# Patient Record
Sex: Female | Born: 1970 | Race: White | Hispanic: No | Marital: Married | Smoking: Never smoker
Health system: Southern US, Community
[De-identification: ages and names within clinical notes are randomized; demographics above are authoritative.]

## PROBLEM LIST (undated history)

## (undated) DIAGNOSIS — D649 Anemia, unspecified: Secondary | ICD-10-CM

## (undated) DIAGNOSIS — K219 Gastro-esophageal reflux disease without esophagitis: Secondary | ICD-10-CM

## (undated) DIAGNOSIS — J45909 Unspecified asthma, uncomplicated: Secondary | ICD-10-CM

## (undated) DIAGNOSIS — M1990 Unspecified osteoarthritis, unspecified site: Secondary | ICD-10-CM

## (undated) DIAGNOSIS — E079 Disorder of thyroid, unspecified: Secondary | ICD-10-CM

## (undated) HISTORY — DX: Unspecified asthma, uncomplicated: J45.909

## (undated) HISTORY — PX: COLONOSCOPY: SHX174

## (undated) HISTORY — DX: Unspecified osteoarthritis, unspecified site: M19.90

## (undated) HISTORY — PX: EYE SURGERY: SHX253

## (undated) HISTORY — DX: Disorder of thyroid, unspecified: E07.9

## (undated) HISTORY — PX: CYST EXCISION: SHX5701

---

## 2008-02-24 ENCOUNTER — Ambulatory Visit: Payer: Self-pay

## 2009-08-22 ENCOUNTER — Ambulatory Visit: Payer: Self-pay | Admitting: Unknown Physician Specialty

## 2009-09-04 ENCOUNTER — Encounter: Payer: Self-pay | Admitting: General Practice

## 2009-09-20 ENCOUNTER — Encounter: Payer: Self-pay | Admitting: General Practice

## 2010-08-11 ENCOUNTER — Emergency Department: Payer: Self-pay | Admitting: Emergency Medicine

## 2012-06-03 ENCOUNTER — Ambulatory Visit: Payer: Self-pay | Admitting: Otolaryngology

## 2012-06-03 LAB — PREGNANCY, URINE: Pregnancy Test, Urine: NEGATIVE m[IU]/mL

## 2012-12-22 ENCOUNTER — Ambulatory Visit: Payer: Self-pay

## 2013-04-18 DIAGNOSIS — E063 Autoimmune thyroiditis: Secondary | ICD-10-CM | POA: Insufficient documentation

## 2014-02-01 ENCOUNTER — Ambulatory Visit: Payer: Self-pay

## 2014-09-27 ENCOUNTER — Ambulatory Visit: Payer: Self-pay | Admitting: Gastroenterology

## 2015-02-07 NOTE — Op Note (Signed)
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   PROCEDURE: Microlaryngoscopy with microsurgical excision of left true vocal cord nodule.   SURGEON: Marion DownerScott Medard Decuir, MD   ANESTHESIA: General endotracheal.   INDICATIONS: This is a 44 year old female with a lesion of the left true vocal cord causing hoarseness.   FINDINGS: This is a polypoid nodule involving the anterior left vocal cord. It is very well circumscribed and just under the surface.   COMPLICATIONS: None.   DESCRIPTION OF PROCEDURE: After obtaining informed consent, the patient was taken to the Operating Room and placed in the supine position. After induction of general endotracheal anesthesia, the patient was turned 90 degrees, placed into position on a shoulder roll with the head extended and eyes protected. A tooth guard was used to protect the upper teeth. A Dedo laryngoscope was then placed in the laryngeal inlet to visualize the vocal cords. The patient was a bit anterior making visualization of the nodule somewhat difficult but with proper positioning and suspension with the Lewy arm I was able to get visualization with some assistance by pressing on the anterior neck to move the nodule into position for removal. The tooth guard was kept in place at all times to protect the upper teeth. The nodule was visualized and photodocumentation of the nodule obtained using a 0 degree scope. The microscope was then moved into position and the vocal cord injected with a very small amount of 1% lidocaine with epinephrine 1:200,000.  With the scrub nurse pressing on the anterior neck to move the larynx slightly posteriorly and move the nodule into position, the nodule was excised using microsurgical dissection with the microlaryngeal scissors. Care  was taken not to injure the underlying vocalis muscle and just basically remove the overlying epithelium where the nodule was located after which the nodule was easily removed. Afrin moistened pledget was used to control minor oozing and postoperative photodocumentation obtained. The patient was then taken out of suspension and returned to the anesthesiologist for awakening. She was awakened and taken to the recovery room in good condition postoperatively. Blood loss was minimal.  ____________________________ Ollen GrossPaul S. Willeen CassBennett, MD psb:slb D: 06/03/2012 17:29:17 ET T: 06/04/2012 08:08:26 ET JOB#: [id number]  cc: Ollen GrossPaul S. Willeen CassBennett, MD, <Dictator> Sandi MealyPAUL S Jermey Closs MD ELECTRONICALLY SIGNED 06/30/2012 8:35

## 2015-02-13 LAB — SURGICAL PATHOLOGY

## 2016-09-03 ENCOUNTER — Other Ambulatory Visit: Payer: Self-pay | Admitting: Obstetrics and Gynecology

## 2016-09-03 DIAGNOSIS — Z1231 Encounter for screening mammogram for malignant neoplasm of breast: Secondary | ICD-10-CM

## 2016-09-09 ENCOUNTER — Encounter: Payer: Self-pay | Admitting: *Deleted

## 2016-09-17 ENCOUNTER — Ambulatory Visit
Admission: RE | Admit: 2016-09-17 | Discharge: 2016-09-17 | Disposition: A | Discharge status: Home / Self Care | Payer: Managed Care, Other (non HMO) | Source: Ambulatory Visit | Attending: Otolaryngology | Admitting: Otolaryngology

## 2016-09-17 ENCOUNTER — Encounter
Admission: RE | Disposition: A | Discharge status: Home / Self Care | Payer: Self-pay | Source: Ambulatory Visit | Attending: Otolaryngology

## 2016-09-17 ENCOUNTER — Ambulatory Visit: Payer: Managed Care, Other (non HMO) | Admitting: Anesthesiology

## 2016-09-17 DIAGNOSIS — J3489 Other specified disorders of nose and nasal sinuses: Secondary | ICD-10-CM | POA: Insufficient documentation

## 2016-09-17 DIAGNOSIS — K219 Gastro-esophageal reflux disease without esophagitis: Secondary | ICD-10-CM | POA: Diagnosis not present

## 2016-09-17 HISTORY — DX: Gastro-esophageal reflux disease without esophagitis: K21.9

## 2016-09-17 HISTORY — PX: SEPTOPLASTY: SHX2393

## 2016-09-17 HISTORY — DX: Anemia, unspecified: D64.9

## 2016-09-17 SURGERY — SEPTOPLASTY, NOSE
Anesthesia: General | Site: Nose | Wound class: Clean Contaminated

## 2016-09-17 MED ORDER — DOXYCYCLINE HYCLATE 100 MG PO CAPS
ORAL_CAPSULE | ORAL | 0 refills | Status: DC
Start: 2016-09-17 — End: 2023-08-20

## 2016-09-17 MED ORDER — ONDANSETRON HCL 4 MG/2ML IJ SOLN
INTRAMUSCULAR | Status: DC | PRN
  Administered 2016-09-17: 4 mg via INTRAVENOUS

## 2016-09-17 MED ORDER — SCOPOLAMINE 1 MG/3DAYS TD PT72
1.0000 | MEDICATED_PATCH | TRANSDERMAL | Status: DC
  Administered 2016-09-17: 1.5 mg via TRANSDERMAL

## 2016-09-17 MED ORDER — LIDOCAINE-EPINEPHRINE 1 %-1:100000 IJ SOLN
INTRAMUSCULAR | Status: DC | PRN
  Administered 2016-09-17: 9 mL

## 2016-09-17 MED ORDER — FENTANYL CITRATE (PF) 100 MCG/2ML IJ SOLN
INTRAMUSCULAR | Status: DC | PRN
  Administered 2016-09-17: 100 ug via INTRAVENOUS

## 2016-09-17 MED ORDER — OXYCODONE HCL 5 MG/5ML PO SOLN: 5.0000 mg | Freq: Once | ORAL | Status: DC | PRN

## 2016-09-17 MED ORDER — ACETAMINOPHEN 10 MG/ML IV SOLN
1000.0000 mg | Freq: Once | INTRAVENOUS | Status: AC
  Administered 2016-09-17: 1000 mg via INTRAVENOUS

## 2016-09-17 MED ORDER — PROPOFOL 10 MG/ML IV BOLUS
INTRAVENOUS | Status: DC | PRN
  Administered 2016-09-17: 150 mg via INTRAVENOUS

## 2016-09-17 MED ORDER — FENTANYL CITRATE (PF) 100 MCG/2ML IJ SOLN: 25.0000 ug | INTRAMUSCULAR | Status: DC | PRN

## 2016-09-17 MED ORDER — OXYCODONE HCL 5 MG PO TABS: 5.0000 mg | ORAL_TABLET | Freq: Once | ORAL | Status: DC | PRN

## 2016-09-17 MED ORDER — PROMETHAZINE HCL 25 MG PO TABS: 25.0000 mg | ORAL_TABLET | Freq: Four times a day (QID) | ORAL | 0 refills | Status: DC | PRN

## 2016-09-17 MED ORDER — PROMETHAZINE HCL 25 MG/ML IJ SOLN: 6.2500 mg | INTRAMUSCULAR | Status: DC | PRN

## 2016-09-17 MED ORDER — ROCURONIUM BROMIDE 100 MG/10ML IV SOLN
INTRAVENOUS | Status: DC | PRN
  Administered 2016-09-17: 15 mg via INTRAVENOUS

## 2016-09-17 MED ORDER — LACTATED RINGERS IV SOLN
INTRAVENOUS | Status: DC
  Administered 2016-09-17: 08:00:00 via INTRAVENOUS

## 2016-09-17 MED ORDER — HYDROCODONE-ACETAMINOPHEN 5-325 MG PO TABS: 1.0000 | ORAL_TABLET | Freq: Four times a day (QID) | ORAL | 0 refills | Status: DC | PRN

## 2016-09-17 MED ORDER — OXYMETAZOLINE HCL 0.05 % NA SOLN
NASAL | Status: DC | PRN
  Administered 2016-09-17: 1 via TOPICAL

## 2016-09-17 MED ORDER — DEXAMETHASONE SODIUM PHOSPHATE 4 MG/ML IJ SOLN
INTRAMUSCULAR | Status: DC | PRN
  Administered 2016-09-17: 10 mg via INTRAVENOUS

## 2016-09-17 MED ORDER — GLYCOPYRROLATE 0.2 MG/ML IJ SOLN
INTRAMUSCULAR | Status: DC | PRN
  Administered 2016-09-17: .1 mg via INTRAVENOUS

## 2016-09-17 MED ORDER — MIDAZOLAM HCL 5 MG/5ML IJ SOLN
INTRAMUSCULAR | Status: DC | PRN
  Administered 2016-09-17: 2 mg via INTRAVENOUS

## 2016-09-17 MED ORDER — LIDOCAINE HCL (CARDIAC) 20 MG/ML IV SOLN
INTRAVENOUS | Status: DC | PRN
  Administered 2016-09-17: 40 mg via INTRAVENOUS

## 2016-09-17 SURGICAL SUPPLY — 18 items
CANISTER SUCT 1200ML W/VALVE (MISCELLANEOUS) ×3 IMPLANT
COAG SUCT 10F 3.5MM HAND CTRL (MISCELLANEOUS) ×3 IMPLANT
DRAPE HEAD BAR (DRAPES) ×3 IMPLANT
GLOVE EXAM LX STRL 7.5 (GLOVE) ×6 IMPLANT
KIT ROOM TURNOVER OR (KITS) ×3 IMPLANT
NEEDLE HYPO 25GX1X1/2 BEV (NEEDLE) ×3 IMPLANT
NS IRRIG 500ML POUR BTL (IV SOLUTION) ×3 IMPLANT
PACK DRAPE NASAL/ENT (PACKS) ×3 IMPLANT
PAD GROUND ADULT SPLIT (MISCELLANEOUS) ×3 IMPLANT
PATTIES SURGICAL .5 X3 (DISPOSABLE) ×3 IMPLANT
SPLINT NASAL SEPTAL PRE-CUT (MISCELLANEOUS) ×3 IMPLANT
SUT CHROMIC 4 0 RB 1X27 (SUTURE) ×3 IMPLANT
SUT ETHILON 4-0 (SUTURE) ×2
SUT ETHILON 4-0 FS2 18XMFL BLK (SUTURE) ×1
SUT PLAIN GUT 4-0 (SUTURE) ×3 IMPLANT
SUTURE ETHLN 4-0 FS2 18XMF BLK (SUTURE) ×1 IMPLANT
SYRINGE 10CC LL (SYRINGE) ×3 IMPLANT
TOWEL OR 17X26 4PK STRL BLUE (TOWEL DISPOSABLE) ×3 IMPLANT

## 2016-09-17 NOTE — Anesthesia Procedure Notes (Signed)
Procedure Name: Intubation Date/Time: 09/17/2016 9:02 AM Performed by: Jimmy PicketAMYOT, Faolan [place] Pre-anesthesia Checklist: Patient identified, Emergency Drugs available, Suction available, Patient being monitored and Timeout performed Patient Re-evaluated:Patient Re-evaluated prior to inductionOxygen Delivery Method: Circle system utilized Preoxygenation: Pre-oxygenation with 100% oxygen Intubation Type: IV induction Ventilation: Mask ventilation without difficulty Laryngoscope Size: Miller and 2 Grade View: Grade I Tube type: Oral Rae Tube size: 7.0 mm Number of attempts: 1 Placement Confirmation: ETT inserted through vocal cords under direct vision,  positive ETCO2 and breath sounds checked- equal and bilateral Tube secured with: Tape Dental Injury: Teeth and Oropharynx as per pre-operative assessment

## 2016-09-17 NOTE — Anesthesia Preprocedure Evaluation (Signed)
Anesthesia Evaluation  Patient identified by MRN, date of birth, ID band Patient awake    Reviewed: Allergy & Precautions, H&P , NPO status , Patient's Chart, lab work & pertinent test results, reviewed documented beta blocker date and time   Airway Mallampati: II  TM Distance: >3 FB Neck ROM: full    Dental no notable dental hx.    Pulmonary neg pulmonary ROS,    Pulmonary exam normal breath sounds clear to auscultation       Cardiovascular Exercise Tolerance: Good negative cardio ROS   Rhythm:regular Rate:Normal     Neuro/Psych negative neurological ROS  negative psych ROS   GI/Hepatic Neg liver ROS, GERD  ,  Endo/Other  negative endocrine ROS  Renal/GU negative Renal ROS  negative genitourinary   Musculoskeletal   Abdominal   Peds  Hematology  (+) anemia ,   Anesthesia Other Findings   Reproductive/Obstetrics negative OB ROS                             Anesthesia Physical Anesthesia Plan  ASA: II  Anesthesia Plan: General   Post-op Pain Management:    Induction:   Airway Management Planned:   Additional Equipment:   Intra-op Plan:   Post-operative Plan:   Informed Consent: I have reviewed the patients History and Physical, chart, labs and discussed the procedure including the risks, benefits and alternatives for the proposed anesthesia with the patient or authorized representative who has indicated his/her understanding and acceptance.   Dental Advisory Given  Plan Discussed with: CRNA  Anesthesia Plan Comments:         Anesthesia Quick Evaluation

## 2016-09-17 NOTE — Transfer of Care (Signed)
Immediate Anesthesia Transfer of Care Note  Patient: Alice Turner  Procedure(s) Performed: Procedure(s): REPAIR OF SEPTAL PERFORATION (N/A)  Patient Location: PACU  Anesthesia Type: General  Level of Consciousness: awake, alert  and patient cooperative  Airway and Oxygen Therapy: Patient Spontanous Breathing and Patient connected to supplemental oxygen  Post-op Assessment: Post-op Vital signs reviewed, Patient's Cardiovascular Status Stable, Respiratory Function Stable, Patent Airway and No signs of Nausea or vomiting  Post-op Vital Signs: Reviewed and stable  Complications: No apparent anesthesia complications

## 2016-09-17 NOTE — Anesthesia Postprocedure Evaluation (Signed)
Anesthesia Post Note  Patient: Janina Mayoonja W Martinec  Procedure(s) Performed: Procedure(s) (LRB): REPAIR OF SEPTAL PERFORATION (N/A)  Patient location during evaluation: PACU Anesthesia Type: General Level of consciousness: awake and alert Pain management: pain level controlled Vital Signs Assessment: post-procedure vital signs reviewed and stable Respiratory status: spontaneous breathing, nonlabored ventilation, respiratory function stable and patient connected to nasal cannula oxygen Cardiovascular status: blood pressure returned to baseline and stable Postop Assessment: no signs of nausea or vomiting Anesthetic complications: no    Scarlette Sliceachel B Beach

## 2016-09-17 NOTE — Op Note (Signed)
09/17/2016  11:25 AM    Janina Mayoonja W Palka  [id number]   Pre-Op Diagnosis:  Septal Perforation  Post-op Diagnosis: Same  Procedure: Repair of nasal septal perforation with mucoperichondrial advancement flaps  Surgeon:  Sandi MealyBennett, Labib Cwynar S  Anesthesia:  General endotracheal  EBL:  Less than 25cc  Complications:  None  Findings: right septal deviation with bony spur. 6-7 mm anterior perforation  Procedure: The patient was taken to the Operating Room and placed in the supine position.  After induction of general endotracheal anesthesia, the table was turned 90 degrees. A time-out was issued to confirm the site and procedure. The nasal septum and lateral nasal wall where then injected with 1% lidocaine with epiniephrine, 1:100,000. The nose was decongested with Afrin soaked pledgets. The patient was then prepped and draped in the usual sterile fashion. Beginning on the left hand side a hemitransfixion incision was then created on the leading edge of the septum on the left.  A subperichondrial plane was elevated posteriorly on the left take care around the perforation to divide the tissue at the perforation edges without creating additional septal defects, and taken back to the perpendicular plate of the ethmoid where subperiosteal plane was elevated posteriorly on the left. The mucoperichondrial flap was elevated in a similar fashion on the right. A large septal spur was identified on the right hand side.  An inferior rim of redundant septal cartilage was removed from where it deviated over the maxillary crest. The perpendicular plate of the ethmoid was separated from the quadrangular cartilage, and a segment of cartilage harvested for grafting, well posterior to the existing cartilage defect. The large septal spur was removed, dividing the septal bone superiorly with Knight scissors, and inferiorly from the maxillary crest with a chisel. There was some bleeding from the maxillary crest which was  easily controlled with the suction Bovie.   A small tear in the mucoperichondrium was unavoidable on the right with dissection around the spur. The mucoperichondrial flaps were widely elevated bilaterally, with elevation extending along the floor of the nose on the left, and up the lateral nasal wall under the inferior turbinate. A horizontal relaxing incision was made where the inferior turbinate attaches to the lateral nasal wall to allow advancement of the flap. An incision was also made superiorly near the dorsal edge of the septal cartilage to allow advancement of the superior aspect of the flap. Because of the existing mucosal tear near the floor of the nose on the right, no additional relaxing incision was made inferiorly, but one was made superiorly along the dorsum. The cartilage graft was thinned out slightly, and was then carefully placed into position to close the existing defect. 4-0 plain gut suture was then used as a quilting stitch to begin reapproximating the flaps to the underlying cartilage, securing the cartilage graft into good position, and carefully advancing the mucoperichondrial flaps towards the defect to close it. Initial sutures were well away from the perforation margin, so as to reduce mucosal tension, then closer to the margin as the closure progressed. Good closure was achieved.   The septal incision was then closed with 4-0 chromic gut suture.  With the septoplasty completed and no active bleeding, nasal septal splints were placed within each nostril and affixed to the septum using a 4-0 nylon suture.  The patient was then returned to the anesthesiologist for awakening, and was taken to the Recovery Room in stable condition.  Disposition:   PACU to home  Plan: Take pain  medications and antibiotics as prescribed. Begin use of saline spray to clear secretions 2-3 times daily, starting tomorrow. No strenuous activity for 2 weeks. Follow-up next Monday.  Sandi Mealy 09/17/2016 11:25 AM

## 2016-09-17 NOTE — H&P (Signed)
History and physical reviewed and will be scanned in later. No change in medical status reported by the patient or family, appears stable for surgery. Some mild post nasal drainage without fever.All questions regarding the procedure answered, and patient (or family if a child) expressed understanding of the procedure.  Sandi MealyBennett, Ebenezer Mccaskey S @TODAY @

## 2016-09-17 NOTE — Discharge Instructions (Signed)
General Anesthesia, Adult, Care After These instructions provide you with information about caring for yourself after your procedure. Your health care provider may also give you more specific instructions. Your treatment has been planned according to current medical practices, but problems sometimes occur. Call your health care provider if you have any problems or questions after your procedure. What can I expect after the procedure? After the procedure, it is common to have:  Vomiting.  A sore throat.  Mental slowness. It is common to feel:  Nauseous.  Cold or shivery.  Sleepy.  Tired.  Sore or achy, even in parts of your body where you did not have surgery. Follow these instructions at home: For at least 24 hours after the procedure:  Do not:  Participate in activities where you could fall or become injured.  Drive.  Use heavy machinery.  Drink alcohol.  Take sleeping pills or medicines that cause drowsiness.  Make important decisions or sign legal documents.  Take care of children on your own.  Rest. Eating and drinking  If you vomit, drink water, juice, or soup when you can drink without vomiting.  Drink enough fluid to keep your urine clear or pale yellow.  Make sure you have little or no nausea before eating solid foods.  Follow the diet recommended by your health care provider. General instructions  Have a responsible adult stay with you until you are awake and alert.  Return to your normal activities as told by your health care provider. Ask your health care provider what activities are safe for you.  Take over-the-counter and prescription medicines only as told by your health care provider.  If you smoke, do not smoke without supervision.  Keep all follow-up visits as told by your health care provider. This is important. Contact a health care provider if:  You continue to have nausea or vomiting at home, and medicines are not helpful.  You  cannot drink fluids or start eating again.  You cannot urinate after 8-12 hours.  You develop a skin rash.  You have fever.  You have increasing redness at the site of your procedure. Get help right away if:  You have difficulty breathing.  You have chest pain.  You have unexpected bleeding.  You feel that you are having a life-threatening or urgent problem. This information is not intended to replace advice given to you by your health care provider. Make sure you discuss any questions you have with your health care provider. Document Released: 01/13/2001 Document Revised: 03/11/2016 Document Reviewed: 09/21/2015 Elsevier Interactive Patient Education  2017 [employer]  Scopolamine skin patches REMOVE PATCH IN 72 HOURS and WASH HANDS IMMEDIATELY What is this medicine? SCOPOLAMINE (skoe POL a meen) is used to prevent nausea and vomiting caused by motion sickness, anesthesia and surgery. This medicine may be used for other purposes; ask your health care provider or pharmacist if you have questions. COMMON BRAND NAME(S): Transderm Scop What should I tell my health care provider before I take this medicine? They need to know if you have any of these conditions: -glaucoma -kidney or liver disease -an unusual or allergic reaction (especially skin allergy) to scopolamine, atropine, other medicines, foods, dyes, or preservatives -pregnant or trying to get pregnant -breast-feeding How should I use this medicine? This medicine is for external use only. Follow the directions on the prescription label. One patch contains enough medicine to prevent motion sickness for up to 3 days. Apply the patch at least 4 hours before you  need it and only wear one disc at a time. Choose an area behind the ear, that is clean, dry, hairless and free from any cuts or irritation. Wipe the area with a clean dry tissue. Peel off the plastic backing of the skin patch, trying not to touch the adhesive side with  your hands. Do not cut the patches. Firmly apply to the area you have chosen, with the metallic side of the patch to the skin and the tan-colored side showing. Once firmly in place, wash your hands well with soap and water. Remove the disc after 3 days, or sooner if you no longer need it. After removing the patch, wash your hands and the area behind your ear thoroughly with soap and water. The patch will still contain some medicine after use. To avoid accidental contact or ingestion by children or pets, fold the used patch in half with the sticky side together and throw away in the trash out of the reach of children and pets. If you need to use a second patch after you remove the first, place it behind the other ear. Talk to your pediatrician regarding the use of this medicine in children. Special care may be needed. Overdosage: If you think you have taken too much of this medicine contact a poison control center or emergency room at once. NOTE: This medicine is only for you. Do not share this medicine with others. What if I miss a dose? Make sure you apply the patch at least 4 hours before you need it. You can apply it the night before traveling. What may interact with this medicine? -benztropine -bethanechol -medicines for anxiety or sleeping problems like diazepam or temazepam -medicines for hay fever and other allergies -medicines for mental depression -muscle relaxants This list may not describe all possible interactions. Give your health care provider a list of all the medicines, herbs, non-prescription drugs, or dietary supplements you use. Also tell them if you smoke, drink alcohol, or use illegal drugs. Some items may interact with your medicine. What should I watch for while using this medicine? Keep the patch dry, if possible, to prevent it from falling off. Limited contact with water, however, as in bathing or swimming, will not affect the system. If the patch falls off, throw it away and  put a new one behind the other ear. You may get drowsy or dizzy. Do not drive, use machinery, or do anything that needs mental alertness until you know how this medicine affects you. Do not stand or sit up quickly, especially if you are an older patient. This reduces the risk of dizzy or fainting spells. Alcohol may interfere with the effect of this medicine. Avoid alcoholic drinks. Your mouth may get dry. Chewing sugarless gum or sucking hard candy, and drinking plenty of water may help. Contact your doctor if the problem does not go away or is severe. This medicine may cause dry eyes and blurred vision. If you wear contact lenses you may feel some discomfort. Lubricating drops may help. See your eye doctor if the problem does not go away or is severe. If you are going to have a magnetic resonance imaging (MRI) procedure, tell your MRI technician if you have this patch on your body. It must be removed before a MRI. What side effects may I notice from receiving this medicine? Side effects that you should report to your doctor or health care professional as soon as possible: -agitation, nervousness, confusion -blurred vision and other eye problems -  dizziness, drowsiness -eye pain or redness in the whites of the eye -hallucinations -pain or difficulty passing urine -skin rash, itching -vomiting Side effects that usually do not require medical attention (report to your doctor or health care professional if they continue or are bothersome): -headache -nausea This list may not describe all possible side effects. Call your doctor for medical advice about side effects. You may report side effects to FDA at 1-800-FDA-1088. Where should I keep my medicine? Keep out of the reach of children. Store at room temperature between 20 and 25 degrees C (68 and 77 degrees F). Throw away any unused medicine after the expiration date. When you remove a patch, fold it and throw it in the trash as described  above. NOTE: This sheet is a summary. It may not cover all possible information. If you have questions about this medicine, talk to your doctor, pharmacist, or health care provider.  2017 Elsevier/Gold Standard (2012-03-05 13:31:48)

## 2016-09-18 ENCOUNTER — Encounter: Payer: Self-pay | Admitting: Otolaryngology

## 2016-09-20 ENCOUNTER — Ambulatory Visit
Admission: RE | Admit: 2016-09-20 | Discharge: 2016-09-20 | Disposition: A | Discharge status: Home / Self Care | Payer: Managed Care, Other (non HMO) | Source: Ambulatory Visit | Attending: Obstetrics and Gynecology | Admitting: Obstetrics and Gynecology

## 2016-09-20 DIAGNOSIS — Z1231 Encounter for screening mammogram for malignant neoplasm of breast: Secondary | ICD-10-CM | POA: Diagnosis present

## 2017-09-15 ENCOUNTER — Other Ambulatory Visit: Payer: Self-pay | Admitting: Endocrinology

## 2017-09-15 ENCOUNTER — Other Ambulatory Visit: Payer: Self-pay | Admitting: Obstetrics and Gynecology

## 2017-09-15 DIAGNOSIS — Z1231 Encounter for screening mammogram for malignant neoplasm of breast: Secondary | ICD-10-CM

## 2017-10-03 ENCOUNTER — Ambulatory Visit
Admission: RE | Admit: 2017-10-03 | Discharge: 2017-10-03 | Disposition: A | Discharge status: Home / Self Care | Payer: 59 | Source: Ambulatory Visit | Attending: Endocrinology | Admitting: Endocrinology

## 2017-10-03 DIAGNOSIS — Z1231 Encounter for screening mammogram for malignant neoplasm of breast: Secondary | ICD-10-CM | POA: Insufficient documentation

## 2018-09-14 ENCOUNTER — Other Ambulatory Visit: Payer: Self-pay | Admitting: Endocrinology

## 2018-09-14 DIAGNOSIS — Z1231 Encounter for screening mammogram for malignant neoplasm of breast: Secondary | ICD-10-CM

## 2018-10-06 ENCOUNTER — Ambulatory Visit
Admission: RE | Admit: 2018-10-06 | Discharge: 2018-10-06 | Disposition: A | Discharge status: Home / Self Care | Payer: 59 | Source: Ambulatory Visit | Attending: Endocrinology | Admitting: Endocrinology

## 2018-10-06 DIAGNOSIS — Z1231 Encounter for screening mammogram for malignant neoplasm of breast: Secondary | ICD-10-CM | POA: Insufficient documentation

## 2019-08-30 ENCOUNTER — Other Ambulatory Visit: Payer: Self-pay | Admitting: Endocrinology

## 2019-08-30 DIAGNOSIS — Z1231 Encounter for screening mammogram for malignant neoplasm of breast: Secondary | ICD-10-CM

## 2019-10-05 ENCOUNTER — Other Ambulatory Visit: Payer: Self-pay

## 2019-10-05 ENCOUNTER — Ambulatory Visit: Payer: BC Managed Care – PPO | Attending: Obstetrics and Gynecology

## 2019-10-05 DIAGNOSIS — M4125 Other idiopathic scoliosis, thoracolumbar region: Secondary | ICD-10-CM

## 2019-10-05 DIAGNOSIS — R293 Abnormal posture: Secondary | ICD-10-CM | POA: Diagnosis present

## 2019-10-05 DIAGNOSIS — M62838 Other muscle spasm: Secondary | ICD-10-CM | POA: Diagnosis present

## 2019-10-05 NOTE — Patient Instructions (Signed)
Stabilization: Diaphragmatic Breathing    Lie with knees bent, feet flat. Place one hand on stomach, other on chest. Breathe deeply through nose, lifting belly hand without any motion of hand on chest. Practice doing this breathing for ~ 5 min. Per night, and when you remember throughout the day or are stressed.     Hold for 30 seconds (5 deep breaths) and repeat 2-3 times on each side once a day

## 2019-10-05 NOTE — Therapy (Signed)
[place] MAIN [facility] SERVICES [address] [place], [place], [address] Phone: [phone]   Fax:  [phone]  Physical Therapy Evaluation  The patient has been informed of current processes in place at Outpatient Rehab to protect patients from Covid-19 exposure including social distancing, schedule modifications, and new cleaning procedures. After discussing their particular risk with a therapist based on the patient's personal risk factors, the patient has decided to proceed with in-person therapy.  Patient Details  Name: Alice Turner MRN: [id number] Date of Birth: 09/07/1971 Referring Provider (PT): Benjaman Kindler   Encounter Date: 10/05/2019  PT End of Session - 10/05/19 1239    Visit Number  1    Number of Visits  10    Date for PT Re-Evaluation  12/14/19    Authorization Type  BCBS/[employer]    Authorization Time Period  12/05/2018 through 12/14/2019    Authorization - Visit Number  1    Authorization - Number of Visits  10    PT Start Time  0730    PT Stop Time  4259    PT Time Calculation (min)  65 min    Activity Tolerance  Patient tolerated treatment well;No increased pain    Behavior During Therapy  WFL for tasks assessed/performed       Past Medical History:  Diagnosis Date  . Anemia    H/O  . GERD (gastroesophageal reflux disease)     Past Surgical History:  Procedure Laterality Date  . COLONOSCOPY    . CYST EXCISION     vocal cord  . SEPTOPLASTY N/A 09/17/2016   Procedure: REPAIR OF SEPTAL PERFORATION;  Surgeon: Clyde Canterbury, MD;  Location: [place];  Service: ENT;  Laterality: N/A;    There were no vitals filed for this visit.       [facility] PT Assessment - 10/05/19 0001      Assessment   Medical Diagnosis  Dyspareunia    Referring Provider (PT)  Benjaman Kindler    Onset Date/Surgical Date  10/04/14    Prior Therapy  none      Balance Screen   Has the patient fallen in the past 6 months  No       [place] residence    Living Arrangements  Spouse/significant other;Children    Type of [place] to enter    Entrance Stairs-Number of Steps  4    [place]  One level      Prior Function   Level of Independence  Independent    Vocation  Full time employment    Chemical engineer      Cognition   Overall Cognitive Status  Within Functional Limits for tasks assessed        Pelvic Floor Physical Therapy Evaluation and Assessment  SCREENING  Falls in last 6 mo: none  Red Flags:  Have you had any night sweats? No (started taking black cohosh) Unexplained weight loss? no Saddle anesthesia? no Unexplained changes in bowel or bladder habits? no  SUBJECTIVE  Patient reports: Has two children, her leakage became worse ~ 5-6 years ago and got worse when she started exercising a lot. She also started having painful intercourse at the same time "he is hitting something in there". Has not been able to wear tampons since second child was born (20 years). Running,  jumping, sneezing, etc. Causes leakage.  "lives at the chiropractor" has frequent LBP that she goes monthly+ to the chiropractor to get adjusted  Precautions:  none  Social/Family/Vocational History:   Full time  Recent Procedures/Tests/Findings:  none  Obstetrical History: 2 vaginal deliveries, "really bad tearing" with both, worse with first.  Gynecological History: Has had cysts, had a leep done ~ 2002  Urinary History: Was having minimal leakage, more if she was vomiting for some reason. Now she has leakage with cough, sneeze, jumping but full bladder emptying if vomiting. (vomits frequently due to bad reflux). Rarely has UUI as well but does have feeling of incomplete emptying frequently.  -marked "almost all the time" she is needing to void again within less than two hours on Female  NIH-CPSI.  Gastrointestinal History: GERD, every-other day BM's not straining  Sexual activity/pain: Pain with deeper thrusting (certain angles)  Location of pain: LBP R neck, shoulder and HA Current pain:  0/10  Max pain:  5/10 Least pain:  0/10 Nature of pain:  Achy, Sharp, shooting (sciatica)  Patient Goals: Improve Bladder control and decrease pain with intercourse.   OBJECTIVE  Posture/Observations:  Sitting:  Standing: L hip high, Slight anterior pelvic tilt, L PSIS high (not as high as L ASIS), Supine: L inflare, R ASIS high, .   Palpation/Segmental Motion/Joint Play: TTP to R>L QL,  lumbar extensors, and Iliacus, B Psoas. NOT TTP to B adductors     Special tests:   Scoliosis- 7 degrees L lumbar curve. Leg-length: L 89cm, R 88cm Supine-to-long-sit: L ankle low in sitting, less low in supine  Range of Motion/Flexibilty:  Spine: R SB 4 fingers, L SB 5 fingers (pulling), L rotation ~ 50% decreased motion with discomfort, 25% decreased R rotation (no pain) Hips:   Strength/MMT:  LE MMT  LE MMT Left Right  Hip flex:  (L2) 4/5 4/5  Hip ext: 4+/5 4/5  Hip abd: 4+/5 4++/5  Hip add: 4/5 4+/5  Hip IR 4/5 4/5  Hip ER 4+/5 4++/5     Abdominal:  Palpation: TTP to R>L iliacus and QL Diastasis: none  Pelvic Floor External Exam: Deferred to follow-up Introitus Appears:  Skin integrity:  Palpation: Cough: Prolapse visible?: Scar mobility:  Internal Vaginal Exam: Strength (PERF):  Symmetry: Palpation: Prolapse:   Internal Rectal Exam: Strength (PERF): Symmetry: Palpation: Prolapse:   Gait Analysis: Deferred to follow up   Pelvic Floor Outcome Measures:  VQ; 7/33, Female NIH-CPSI: 27/43  INTERVENTIONS THIS SESSION: Self-care: Educated on the structure and function of the pelvic floor in relation to their symptoms as well as the POC, and initial HEP in order to set patient expectations and understanding from which we will build on in the future  sessions. Educated on diaphragmatic breathing to begin improving coordination on deep core as well as down-regulating nervous system and preparing for TP release success. Therex: educated on and practiced side-stretch to begin decreasing QL tension and allow for decreased pressure on lumbar nerve roots to decrease pain and spasm.  Total time: 65 min.           Objective measurements completed on examination: See above findings.                PT Short Term Goals - 10/05/19 1318      PT SHORT TERM GOAL #1   Title  Patient will demonstrate a coordinated contraction, relaxation, and bulge of the pelvic floor muscles to demonstrate functional recruitment and motion and allow  for further strengthening.    Baseline  Pt history suggests poor PFM coordination, breathing dysfunction noted    Time  5    Period  Weeks    Status  New    Target Date  11/09/19      PT SHORT TERM GOAL #2   Title  Patient will demonstrate improved pelvic alignment and balance of musculature surrounding the pelvis to facilitate decreased PFM spasms and decrease pelvic pain.    Baseline  L up-slip, R anterior rotation, spasms surrounding pelvis, LLD, L scoliosis    Time  5    Period  Weeks    Status  New    Target Date  11/09/19      PT SHORT TERM GOAL #3   Title  Patient will demonstrate HEP x1 in the clinic to demonstrate understanding and proper form to allow for further improvement.    Baseline  Pt. lacks knowledge of therapeutic exercises to decrease pain, spasm, and UI    Time  5    Period  Weeks    Status  New    Target Date  11/09/19      PT SHORT TERM GOAL #4   Title  Pt. will be compliant with wearing heel-lift during ~90% of the day while being active to help prevent return of spasms and malalignment.    Baseline  Pt. not using heel-lift, going to chiropractor monthly for adjustment.    Time  5    Period  Weeks    Status  New    Target Date  11/09/19        PT Long Term  Goals - 10/05/19 1325      PT LONG TERM GOAL #1   Title  Patient will report no episodes of SUI over the course of the prior two weeks to demonstrate improved functional ability.    Baseline  Pt. having leakage with cough, jump, etc. but full emptying with vomiting (GERD causes her to vomit ~ every month)    Time  10    Period  Weeks    Status  New    Target Date  12/14/19      PT LONG TERM GOAL #2   Title  Patient will report no pain with intercourse to demonstrate improved functional ability.    Baseline  Pt. having pain with deep thrusting    Time  10    Period  Weeks    Status  New    Target Date  12/14/19      PT LONG TERM GOAL #3   Title  Patient will describe pain no greater than 2/10 following 30+ min. of moderate level exercise to demonstrate improved functional ability.    Baseline  Pt. has stopped exercising regularly due to increased pain and SUI. Max pain over last week 5/10    Time  10    Period  Weeks    Status  New    Target Date  12/14/19      PT LONG TERM GOAL #4   Title  Patient will score less than or equal to 30% on the Female NIH-CPSI and 10% on the VQ to demonstrate a reduction in pain, urinary symptoms, and an improved quality of life.    Baseline  VQ; 7/33 (21%), Female NIH-CPSI: 27/43 (63%)    Time  10    Period  Weeks    Status  New    Target Date  12/14/19  Plan - 10/05/19 1248    Clinical Impression Statement  Pt. is a 48 y/o female who presents today with cheif c/o SUI and Dyspareunia as well as mild UUI and urgency, LBP, and neck pain. Her PMH is significant for two vaginal deliveries, 21 and 17 years ago with significant tearing (grade 3-4), GERD, and h/o recurrent LBP and neck pain. Her clinical exam revealed breathing dysfunction, a 7 degree L lumbar scliotic curve as well as a LLD with the LLE long by 1cm, a L posterior/R anterior innominate rotation, spasms surrounding the R>L hip and L>R hip weakness, erythema overlying the  C1-C2 cervical region with TTP through cervical extensors (Pt. adjusted by chiropractic yesterday), decreased mobility through the upper thoracic spine and lumbosacral junction, and highly reactive triggerpoints around B calves. She will benefit from skilled pelvic health PT to address the noted defecits and to continue to assess for and address other potential causes of Sx.    Personal Factors and Comorbidities  Comorbidity 3+    Comorbidities  GERD, LLD, Scoliosis    Examination-Activity Limitations  Continence    Examination-Participation Restrictions  Interpersonal Relationship;Other   Exercise   Stability/Clinical Decision Making  Evolving/Moderate complexity    Clinical Decision Making  Moderate    Rehab Potential  Good    PT Frequency  1x / week    PT Duration  Other (comment)   10 weeks   PT Treatment/Interventions  ADLs/Self Care Home Management;Biofeedback;Moist Heat;Electrical Stimulation;Traction;Ultrasound;Therapeutic activities;Functional mobility training;Gait training;Therapeutic exercise;Neuromuscular re-education;Patient/family education;Manual techniques;Scar mobilization;Taping;Dry needling;Passive range of motion;Joint Manipulations;Spinal Manipulations    PT Next Visit Plan  TP release vs. DN and up-slip correction for L, MET correction for R anterior innominate rotation.    PT Home Exercise Plan  diaphragmatic breathing, side-stretch.    Consulted and Agree with Plan of Care  Patient       Patient will benefit from skilled therapeutic intervention in order to improve the following deficits and impairments:  Decreased mobility, Increased muscle spasms, Decreased scar mobility, Improper body mechanics, Decreased activity tolerance, Decreased coordination, Decreased strength, Increased fascial restricitons, Pain, Postural dysfunction, Impaired flexibility  Visit Diagnosis: Other idiopathic scoliosis, thoracolumbar region  Other muscle spasm  Abnormal  posture     Problem List There are no problems to display for this patient.  Willa Rough DPT, ATC Willa Rough 10/05/2019, 1:33 PM  [place] MAIN [facility] SERVICES [address] [place], [place], [address] Phone: [phone]   Fax:  [phone]  Name: Alice Turner MRN: [id number] Date of Birth: 1971/03/02

## 2019-10-11 ENCOUNTER — Ambulatory Visit: Payer: BC Managed Care – PPO

## 2019-10-11 ENCOUNTER — Other Ambulatory Visit: Payer: Self-pay

## 2019-10-11 DIAGNOSIS — M4125 Other idiopathic scoliosis, thoracolumbar region: Secondary | ICD-10-CM | POA: Diagnosis not present

## 2019-10-11 DIAGNOSIS — M62838 Other muscle spasm: Secondary | ICD-10-CM

## 2019-10-11 DIAGNOSIS — R293 Abnormal posture: Secondary | ICD-10-CM

## 2019-10-11 NOTE — Patient Instructions (Signed)

## 2019-10-11 NOTE — Therapy (Signed)
[place] MAIN [facility] SERVICES [address] [place], [place], [address] Phone: [phone]   Fax:  [phone]  Physical Therapy Treatment  The patient has been informed of current processes in place at Outpatient Rehab to protect patients from Covid-19 exposure including social distancing, schedule modifications, and new cleaning procedures. After discussing their particular risk with a therapist based on the patient's personal risk factors, the patient has decided to proceed with in-person therapy.   Patient Details  Name: Alice Turner MRN: [id number] Date of Birth: 26-Sep-1971 Referring Provider (PT): Benjaman Kindler   Encounter Date: 10/11/2019  PT End of Session - 10/12/19 0739    Visit Number  2    Number of Visits  10    Date for PT Re-Evaluation  12/14/19    Authorization Type  BCBS/UHC    Authorization Time Period  12/05/2018 through 12/14/2019    Authorization - Visit Number  2    Authorization - Number of Visits  10    PT Start Time  0738    PT Stop Time  0838    PT Time Calculation (min)  60 min    Activity Tolerance  Patient tolerated treatment well;No increased pain    Behavior During Therapy  WFL for tasks assessed/performed       Past Medical History:  Diagnosis Date  . Anemia    H/O  . GERD (gastroesophageal reflux disease)     Past Surgical History:  Procedure Laterality Date  . COLONOSCOPY    . CYST EXCISION     vocal cord  . SEPTOPLASTY N/A 09/17/2016   Procedure: REPAIR OF SEPTAL PERFORATION;  Surgeon: Clyde Canterbury, MD;  Location: [place];  Service: ENT;  Laterality: N/A;    There were no vitals filed for this visit.   Pelvic Floor Physical Therapy Treatment Note  SCREENING  Changes in medications, allergies, or medical history?: none    SUBJECTIVE  Patient reports: No change since prior visit.  Precautions:  none  Pain update:  Location of pain: LBP R neck, shoulder and  HA Current pain: 0/10  Max pain: 5/10 Least pain: 0/10 Nature of pain: Achy, Sharp, shooting (sciatica)  Patient Goals: Improve Bladder control and decrease pain with intercourse.   OBJECTIVE  Changes in: Posture/Observations:  L up-slip and R anterior innominate rotation  Leg-length: LLE 1 cm longer than R  Range of Motion/Flexibilty:    Strength/MMT:  LE MMT:  Pelvic floor:  Abdominal:   Palpation:  Gait Analysis:  INTERVENTIONS THIS SESSION: Manual: Performed TP release to L QL and Iliacus and up-slip correction for L up-slip, MET correctionx2 for R anterior rotation to decrease spasm and pain and allow for improved balance of musculature for improved function and decreased symptoms and improve pelvic alignment to allow for maintenance of spasm reduction for long-term relief.  Total time: 60 min.                             PT Short Term Goals - 10/05/19 1318      PT SHORT TERM GOAL #1   Title  Patient will demonstrate a coordinated contraction, relaxation, and bulge of the pelvic floor muscles to demonstrate functional recruitment and motion and allow for further strengthening.    Baseline  Pt history suggests poor PFM coordination, breathing dysfunction noted    Time  5    Period  Weeks    Status  New    Target Date  11/09/19      PT SHORT TERM GOAL #2   Title  Patient will demonstrate improved pelvic alignment and balance of musculature surrounding the pelvis to facilitate decreased PFM spasms and decrease pelvic pain.    Baseline  L up-slip, R anterior rotation, spasms surrounding pelvis, LLD, L scoliosis    Time  5    Period  Weeks    Status  New    Target Date  11/09/19      PT SHORT TERM GOAL #3   Title  Patient will demonstrate HEP x1 in the clinic to demonstrate understanding and proper form to allow for further improvement.    Baseline  Pt. lacks knowledge of therapeutic exercises to decrease pain, spasm, and UI     Time  5    Period  Weeks    Status  New    Target Date  11/09/19      PT SHORT TERM GOAL #4   Title  Pt. will be compliant with wearing heel-lift during ~90% of the day while being active to help prevent return of spasms and malalignment.    Baseline  Pt. not using heel-lift, going to chiropractor monthly for adjustment.    Time  5    Period  Weeks    Status  New    Target Date  11/09/19        PT Long Term Goals - 10/05/19 1325      PT LONG TERM GOAL #1   Title  Patient will report no episodes of SUI over the course of the prior two weeks to demonstrate improved functional ability.    Baseline  Pt. having leakage with cough, jump, etc. but full emptying with vomiting (GERD causes her to vomit ~ every month)    Time  10    Period  Weeks    Status  New    Target Date  12/14/19      PT LONG TERM GOAL #2   Title  Patient will report no pain with intercourse to demonstrate improved functional ability.    Baseline  Pt. having pain with deep thrusting    Time  10    Period  Weeks    Status  New    Target Date  12/14/19      PT LONG TERM GOAL #3   Title  Patient will describe pain no greater than 2/10 following 30+ min. of moderate level exercise to demonstrate improved functional ability.    Baseline  Pt. has stopped exercising regularly due to increased pain and SUI. Max pain over last week 5/10    Time  10    Period  Weeks    Status  New    Target Date  12/14/19      PT LONG TERM GOAL #4   Title  Patient will score less than or equal to 30% on the Female NIH-CPSI and 10% on the VQ to demonstrate a reduction in pain, urinary symptoms, and an improved quality of life.    Baseline  VQ; 7/33 (21%), Female NIH-CPSI: 27/43 (63%)    Time  10    Period  Weeks    Status  New    Target Date  12/14/19            Plan - 10/12/19 0740    Clinical Impression Statement  Pt. Responded well to all interventions today, demonstrating improved Pelvic alignment, decreased spasm, as  well  as understanding and correct performance of all education and exercises provided today. They will continue to benefit from skilled physical therapy to work toward remaining goals and maximize function as well as decrease likelihood of symptom increase or recurrence.     PT Next Visit Plan  further TP release PRN. re-check alignment, give deep-core stability    PT Home Exercise Plan  diaphragmatic breathing, side-stretch, heel lift on R.    Consulted and Agree with Plan of Care  Patient       Patient will benefit from skilled therapeutic intervention in order to improve the following deficits and impairments:     Visit Diagnosis: Other idiopathic scoliosis, thoracolumbar region  Other muscle spasm  Abnormal posture     Problem List There are no problems to display for this patient.  Willa Rough DPT, ATC Willa Rough 10/12/2019, 9:52 AM  [place] MAIN [facility] SERVICES [address] [place], [place], [address] Phone: [phone]   Fax:  [phone]  Name: Alice Turner MRN: [id number] Date of Birth: December 04, 1970

## 2019-10-18 ENCOUNTER — Ambulatory Visit: Payer: BC Managed Care – PPO

## 2019-10-18 ENCOUNTER — Ambulatory Visit
Admission: RE | Admit: 2019-10-18 | Discharge: 2019-10-18 | Disposition: A | Discharge status: Home / Self Care | Payer: BC Managed Care – PPO | Source: Ambulatory Visit | Attending: Endocrinology | Admitting: Endocrinology

## 2019-10-18 ENCOUNTER — Other Ambulatory Visit: Payer: Self-pay

## 2019-10-18 DIAGNOSIS — R293 Abnormal posture: Secondary | ICD-10-CM

## 2019-10-18 DIAGNOSIS — M4125 Other idiopathic scoliosis, thoracolumbar region: Secondary | ICD-10-CM | POA: Diagnosis not present

## 2019-10-18 DIAGNOSIS — Z1231 Encounter for screening mammogram for malignant neoplasm of breast: Secondary | ICD-10-CM | POA: Diagnosis present

## 2019-10-18 DIAGNOSIS — M62838 Other muscle spasm: Secondary | ICD-10-CM

## 2019-10-18 NOTE — Patient Instructions (Signed)
   Let the top arm rest on your side, and slide along the torso as you rotate. Breathe in as you come forward, out as you open up. Do 2x15 on each side.   Start on your side with your hand on a chair or the bed, make sure that your knees, your hips, and your shoulders are in a straight line. Bring your hips up  Hold for ~ 30 seconds, (or until you feel like you are too tired to hold it correctly). Repeat 2-3 times (up to ~ 1:30 sec. Total).   As you get stronger, you can lengthen each hold. When you can hold for ~ 1:30 straight, try switching to on your knees, then to your feet. You will need to decrease your hold time initially and build back up to the full 1:30.

## 2019-10-18 NOTE — Therapy (Signed)
[facility] MAIN [facility] SERVICES [address] [place], [place], [address] Phone: [phone]   Fax:  [phone]  Physical Therapy Treatment  The patient has been informed of current processes in place at Outpatient Rehab to protect patients from Covid-19 exposure including social distancing, schedule modifications, and new cleaning procedures. After discussing their particular risk with a therapist based on the patient's personal risk factors, the patient has decided to proceed with in-person therapy.   Patient Details  Name: Alice Turner MRN: [id number] Date of Birth: 1971/04/18 Referring Provider (PT): Christeen Douglas   Encounter Date: 10/18/2019  PT End of Session - 10/18/19 0854    Visit Number  3    Number of Visits  10    Date for PT Re-Evaluation  12/14/19    Authorization Type  BCBS/UHC    Authorization Time Period  12/05/2018 through 12/14/2019    Authorization - Visit Number  2    Authorization - Number of Visits  10    PT Start Time  0735    PT Stop Time  0835    PT Time Calculation (min)  60 min    Activity Tolerance  Patient tolerated treatment well;No increased pain    Behavior During Therapy  WFL for tasks assessed/performed       Past Medical History:  Diagnosis Date  . Anemia    H/O  . GERD (gastroesophageal reflux disease)     Past Surgical History:  Procedure Laterality Date  . COLONOSCOPY    . CYST EXCISION     vocal cord  . SEPTOPLASTY N/A 09/17/2016   Procedure: REPAIR OF SEPTAL PERFORATION;  Surgeon: Geanie Logan, MD;  Location: [facility];  Service: ENT;  Laterality: N/A;    There were no vitals filed for this visit.     Pelvic Floor Physical Therapy Treatment Note  SCREENING  Changes in medications, allergies, or medical history?: none    SUBJECTIVE  Patient reports: No problems, the heel lift has been fine. Does not wear her shoes all day so she only had some LBP on Friday after  being on feet for ~ 4-5. Has not had any shoulder/HA pain but has not worked since Wednesday.   Precautions:  none  Pain update:  Location of pain: LBP R neck, shoulder and HA Current pain: 0/10  Max pain: 3/10 Least pain: 0/10 Nature of pain: Achy, Sharp, shooting (sciatica)  Patient Goals: Improve Bladder control and decrease pain with intercourse.   OBJECTIVE  Changes in: Posture/Observations:  ASIS and PSIS level in standing with helel-lift. L lumbar scoliosis noted  Range of Motion/Flexibilty:  Decreased L >R thoracolumbar rotation in side-lying with some pain at ~ 30% of normal  ROM and to L,, only discomfort with overpressure in R rot at ~ 50% normal ROM  Strength/MMT:  LE MMT:  Pelvic floor:  Abdominal:   Palpation: TTP to R>L Iliacus and Psoas, L lumbar paraspinals and Multifidus  Gait Analysis:  INTERVENTIONS THIS SESSION: Manual: Performed TP release to R Iliacus and Psoas, L lumbar paraspinals and Multifidus to decrease spasm and pain and allow for improved balance of musculature for improved function and decreased symptoms.  Therex: Educated on and practiced bow-and-arrow and side-plank on a chair to improve mobility of joint and surrounding connective tissue and decrease pressure on nerve roots for improved conductivity and function of down-stream tissues and to improve spinal alignment and stability to  Decrease pressure on nerve roots and distal  Sx.   Total time: 60 min.                            PT Short Term Goals - 10/05/19 1318      PT SHORT TERM GOAL #1   Title  Patient will demonstrate a coordinated contraction, relaxation, and bulge of the pelvic floor muscles to demonstrate functional recruitment and motion and allow for further strengthening.    Baseline  Pt history suggests poor PFM coordination, breathing dysfunction noted    Time  5    Period  Weeks    Status  New    Target Date  11/09/19      PT SHORT  TERM GOAL #2   Title  Patient will demonstrate improved pelvic alignment and balance of musculature surrounding the pelvis to facilitate decreased PFM spasms and decrease pelvic pain.    Baseline  L up-slip, R anterior rotation, spasms surrounding pelvis, LLD, L scoliosis    Time  5    Period  Weeks    Status  New    Target Date  11/09/19      PT SHORT TERM GOAL #3   Title  Patient will demonstrate HEP x1 in the clinic to demonstrate understanding and proper form to allow for further improvement.    Baseline  Pt. lacks knowledge of therapeutic exercises to decrease pain, spasm, and UI    Time  5    Period  Weeks    Status  New    Target Date  11/09/19      PT SHORT TERM GOAL #4   Title  Pt. will be compliant with wearing heel-lift during ~90% of the day while being active to help prevent return of spasms and malalignment.    Baseline  Pt. not using heel-lift, going to chiropractor monthly for adjustment.    Time  5    Period  Weeks    Status  New    Target Date  11/09/19        PT Long Term Goals - 10/05/19 1325      PT LONG TERM GOAL #1   Title  Patient will report no episodes of SUI over the course of the prior two weeks to demonstrate improved functional ability.    Baseline  Pt. having leakage with cough, jump, etc. but full emptying with vomiting (GERD causes her to vomit ~ every month)    Time  10    Period  Weeks    Status  New    Target Date  12/14/19      PT LONG TERM GOAL #2   Title  Patient will report no pain with intercourse to demonstrate improved functional ability.    Baseline  Pt. having pain with deep thrusting    Time  10    Period  Weeks    Status  New    Target Date  12/14/19      PT LONG TERM GOAL #3   Title  Patient will describe pain no greater than 2/10 following 30+ min. of moderate level exercise to demonstrate improved functional ability.    Baseline  Pt. has stopped exercising regularly due to increased pain and SUI. Max pain over last week  5/10    Time  10    Period  Weeks    Status  New    Target Date  12/14/19      PT LONG TERM GOAL #  4   Title  Patient will score less than or equal to 30% on the Female NIH-CPSI and 10% on the VQ to demonstrate a reduction in pain, urinary symptoms, and an improved quality of life.    Baseline  VQ; 7/33 (21%), Female NIH-CPSI: 27/43 (63%)    Time  10    Period  Weeks    Status  New    Target Date  12/14/19            Plan - 10/18/19 0855    Clinical Impression Statement  Pt. Responded well to all interventions today, demonstrating improved spinal mobility and decreased spasm and TTP, as well as understanding and correct performance of all education and exercises provided today. They will continue to benefit from skilled physical therapy to work toward remaining goals and maximize function as well as decrease likelihood of symptom increase or recurrence.     PT Next Visit Plan  re-assess spinal mobility in rotation, TP release to multifidus PRN, give deep-core stability, assess internally when appropriate    PT Home Exercise Plan  diaphragmatic breathing, side-stretch, heel lift on R, R chair side-plank, bow-and-arrow    Consulted and Agree with Plan of Care  Patient       Patient will benefit from skilled therapeutic intervention in order to improve the following deficits and impairments:     Visit Diagnosis: Other idiopathic scoliosis, thoracolumbar region  Other muscle spasm  Abnormal posture     Problem List There are no problems to display for this patient.  Willa Rough DPT, ATC Willa Rough 10/18/2019, 8:57 AM  [place] MAIN [facility] SERVICES [address] [place], [place], [address] Phone: [phone]   Fax:  [phone]  Name: Alice Turner MRN: [id number] Date of Birth: 1971-03-14

## 2019-10-27 ENCOUNTER — Ambulatory Visit: Payer: BC Managed Care – PPO | Attending: Obstetrics and Gynecology

## 2019-10-27 ENCOUNTER — Other Ambulatory Visit: Payer: Self-pay

## 2019-10-27 DIAGNOSIS — M4125 Other idiopathic scoliosis, thoracolumbar region: Secondary | ICD-10-CM | POA: Insufficient documentation

## 2019-10-27 DIAGNOSIS — R293 Abnormal posture: Secondary | ICD-10-CM | POA: Diagnosis present

## 2019-10-27 DIAGNOSIS — M62838 Other muscle spasm: Secondary | ICD-10-CM | POA: Diagnosis present

## 2019-10-27 NOTE — Therapy (Signed)
[facility] MAIN [facility] SERVICES [address] [place], [place], [address] Phone: [phone]   Fax:  [phone]  Physical Therapy Treatment  The patient has been informed of current processes in place at Outpatient Rehab to protect patients from Covid-19 exposure including social distancing, schedule modifications, and new cleaning procedures. After discussing their particular risk with a therapist based on the patient's personal risk factors, the patient has decided to proceed with in-person therapy.   Patient Details  Name: Alice Turner MRN: [id number] Date of Birth: 1971-09-22 Referring Provider (PT): Christeen Douglas   Encounter Date: 10/27/2019  PT End of Session - 10/28/19 1322    Visit Number  4    Number of Visits  10    Date for PT Re-Evaluation  12/14/19    Authorization Type  BCBS/UHC    Authorization Time Period  12/05/2018 through 12/14/2019    Authorization - Visit Number  4    Authorization - Number of Visits  10    PT Start Time  1735    PT Stop Time  1840    PT Time Calculation (min)  65 min    Activity Tolerance  Patient tolerated treatment well;No increased pain    Behavior During Therapy  WFL for tasks assessed/performed       Past Medical History:  Diagnosis Date  . Anemia    H/O  . GERD (gastroesophageal reflux disease)     Past Surgical History:  Procedure Laterality Date  . COLONOSCOPY    . CYST EXCISION     vocal cord  . SEPTOPLASTY N/A 09/17/2016   Procedure: REPAIR OF SEPTAL PERFORATION;  Surgeon: Geanie Logan, MD;  Location: [facility];  Service: ENT;  Laterality: N/A;    There were no vitals filed for this visit.    Pelvic Floor Physical Therapy Treatment Note  SCREENING  Changes in medications, allergies, or medical history?: none    SUBJECTIVE  Patient reports: Had a really hard time with the side-plank because of her elbow. She has an adjustment coming up on Monday and has found  that her pain typically increases before she goes back.   Precautions:  none  Pain update:  Location of pain: R LBP R neck, R elbow Current pain: 3/10  Max pain: 3/10 Least pain: 0/10 Nature of pain: Achy, Sharp, shooting (sciatica)  **decreased pain but some achyness following treatment.    Patient Goals: Improve Bladder control and decrease pain with intercourse.   OBJECTIVE  Changes in: Posture/Observations:  ASIS and PSIS level in standing with heel-lift. L lumbar scoliosis noted  Range of Motion/Flexibilty:  Decreased L >R thoracolumbar rotation in side-lying with some pain at ~ 30% of normal  ROM and to L,, only discomfort with overpressure in R rot at ~ 50% normal ROM (from previous session)  Strength/MMT:  LE MMT:  Pelvic floor:  Abdominal:   Palpation: TTP to multifidus along the R side of the thoracic and lumbar regions  Gait Analysis:  INTERVENTIONS THIS SESSION: Manual: Performed MFR to R lateral torso and TP release to multifidus along the R side of the thoracic and lumbar regions as well as provided overpressure into a R thoracolumbar MWM and grade 2-3 PA and rotational mobs through T8-L2 to improve rotational ROM and mobility to decrease scoliotic curve and pressure on nerve roots for decreased spasm and pain. Performed TP release to R wrist extensors to decrease pain and spasm and allow  Pt. To perform  her HEP. Therex: Educated on and practiced thoracic extensions over a towel roll to facilitate continued improved thoracic mobility and improved posture/decreased pressure on nerves.  Total time: 60 min.                              PT Short Term Goals - 10/05/19 1318      PT SHORT TERM GOAL #1   Title  Patient will demonstrate a coordinated contraction, relaxation, and bulge of the pelvic floor muscles to demonstrate functional recruitment and motion and allow for further strengthening.    Baseline  Pt history suggests  poor PFM coordination, breathing dysfunction noted    Time  5    Period  Weeks    Status  New    Target Date  11/09/19      PT SHORT TERM GOAL #2   Title  Patient will demonstrate improved pelvic alignment and balance of musculature surrounding the pelvis to facilitate decreased PFM spasms and decrease pelvic pain.    Baseline  L up-slip, R anterior rotation, spasms surrounding pelvis, LLD, L scoliosis    Time  5    Period  Weeks    Status  New    Target Date  11/09/19      PT SHORT TERM GOAL #3   Title  Patient will demonstrate HEP x1 in the clinic to demonstrate understanding and proper form to allow for further improvement.    Baseline  Pt. lacks knowledge of therapeutic exercises to decrease pain, spasm, and UI    Time  5    Period  Weeks    Status  New    Target Date  11/09/19      PT SHORT TERM GOAL #4   Title  Pt. will be compliant with wearing heel-lift during ~90% of the day while being active to help prevent return of spasms and malalignment.    Baseline  Pt. not using heel-lift, going to chiropractor monthly for adjustment.    Time  5    Period  Weeks    Status  New    Target Date  11/09/19        PT Long Term Goals - 10/05/19 1325      PT LONG TERM GOAL #1   Title  Patient will report no episodes of SUI over the course of the prior two weeks to demonstrate improved functional ability.    Baseline  Pt. having leakage with cough, jump, etc. but full emptying with vomiting (GERD causes her to vomit ~ every month)    Time  10    Period  Weeks    Status  New    Target Date  12/14/19      PT LONG TERM GOAL #2   Title  Patient will report no pain with intercourse to demonstrate improved functional ability.    Baseline  Pt. having pain with deep thrusting    Time  10    Period  Weeks    Status  New    Target Date  12/14/19      PT LONG TERM GOAL #3   Title  Patient will describe pain no greater than 2/10 following 30+ min. of moderate level exercise to  demonstrate improved functional ability.    Baseline  Pt. has stopped exercising regularly due to increased pain and SUI. Max pain over last week 5/10    Time  10  Period  Weeks    Status  New    Target Date  12/14/19      PT LONG TERM GOAL #4   Title  Patient will score less than or equal to 30% on the Female NIH-CPSI and 10% on the VQ to demonstrate a reduction in pain, urinary symptoms, and an improved quality of life.    Baseline  VQ; 7/33 (21%), Female NIH-CPSI: 27/43 (63%)    Time  10    Period  Weeks    Status  New    Target Date  12/14/19            Plan - 10/28/19 1323    Clinical Impression Statement  Pt. Responded well to all interventions today, demonstrating improved thoracolumbar ROM and decreased pain as well as understanding and correct performance of all education and exercises provided today. They will continue to benefit from skilled physical therapy to work toward remaining goals and maximize function as well as decrease likelihood of symptom increase or recurrence.     PT Next Visit Plan  Ask about bladder and pain with intercourse since prior visit, assess and treat internally. give deep-core stability, assess internally when appropriate    PT Home Exercise Plan  diaphragmatic breathing, side-stretch, heel lift on R, R chair side-plank, bow-and-arrow    Consulted and Agree with Plan of Care  Patient       Patient will benefit from skilled therapeutic intervention in order to improve the following deficits and impairments:     Visit Diagnosis: Other idiopathic scoliosis, thoracolumbar region  Other muscle spasm  Abnormal posture     Problem List There are no problems to display for this patient.  Willa Rough DPT, ATC Willa Rough 10/28/2019, 1:25 PM  [place] MAIN [facility] SERVICES [address] [place], [place], [address] Phone: [phone]   Fax:  [phone]  Name: Alice Turner MRN:  [id number] Date of Birth: 1971-10-16

## 2019-10-27 NOTE — Patient Instructions (Signed)
   Place foam roller or towel under your upper back between your shoulder blades. Support your head with your hands, elbows forward, and gently rock back and forth and side to side to improve motion in your back.   Move the foam roller or towel up and down to a few spots in the upper back, repeating the process.    

## 2019-11-03 ENCOUNTER — Other Ambulatory Visit: Payer: Self-pay

## 2019-11-03 ENCOUNTER — Ambulatory Visit: Payer: BC Managed Care – PPO

## 2019-11-03 DIAGNOSIS — R293 Abnormal posture: Secondary | ICD-10-CM

## 2019-11-03 DIAGNOSIS — M4125 Other idiopathic scoliosis, thoracolumbar region: Secondary | ICD-10-CM

## 2019-11-03 DIAGNOSIS — M62838 Other muscle spasm: Secondary | ICD-10-CM

## 2019-11-03 NOTE — Therapy (Signed)
[facility] MAIN [facility] SERVICES [address] [place], [place], [address] Phone: [phone]   Fax:  [phone]  Physical Therapy Treatment  The patient has been informed of current processes in place at Outpatient Rehab to protect patients from Covid-19 exposure including social distancing, schedule modifications, and new cleaning procedures. After discussing their particular risk with a therapist based on the patient's personal risk factors, the patient has decided to proceed with in-person therapy.   Patient Details  Name: Alice Turner MRN: [id number] Date of Birth: 08-08-1971 Referring Provider (PT): Christeen Douglas   Encounter Date: 11/03/2019  PT End of Session - 11/04/19 0800    Visit Number  5    Number of Visits  10    Date for PT Re-Evaluation  12/14/19    Authorization Type  BCBS/UHC    Authorization Time Period  12/05/2018 through 12/14/2019    Authorization - Visit Number  5    Authorization - Number of Visits  10    PT Start Time  1740    PT Stop Time  1835    PT Time Calculation (min)  55 min    Activity Tolerance  Patient tolerated treatment well;No increased pain    Behavior During Therapy  WFL for tasks assessed/performed       Past Medical History:  Diagnosis Date  . Anemia    H/O  . GERD (gastroesophageal reflux disease)     Past Surgical History:  Procedure Laterality Date  . COLONOSCOPY    . CYST EXCISION     vocal cord  . SEPTOPLASTY N/A 09/17/2016   Procedure: REPAIR OF SEPTAL PERFORATION;  Surgeon: Geanie Logan, MD;  Location: [facility];  Service: ENT;  Laterality: N/A;    There were no vitals filed for this visit.  Pelvic Floor Physical Therapy Treatment Note  SCREENING  Changes in medications, allergies, or medical history?: none    SUBJECTIVE  Patient reports: She is doing well, her elbow is no better or worse. Neither her neck or her back are hurting, skipped her chiropractor. No  change in bladder control. Did not do the jumping things with her exercise so she has not tested it yet. Has not had any nocturia. Had leakage with sneeze while she had a full bladder.  Precautions:  none  Pain update:  Location of pain: R elbow Current pain: 0/10  Max pain: 5/10 Least pain: 0/10 Nature of pain: achyness with sharp burst if she tries to pick up something heavy, into her fingers.  **pain not addressed this visit to prioritize leakage Sx.  Patient Goals: Improve Bladder control and decrease pain with intercourse.   OBJECTIVE  Changes in: Posture/Observations:  ASIS and PSIS level in standing with heel-lift. L lumbar scoliosis noted (from prior session)  Range of Motion/Flexibilty:  Decreased L >R thoracolumbar rotation in side-lying with some pain at ~ 30% of normal  ROM and to L,, only discomfort with overpressure in R rot at ~ 50% normal ROM (from previous session)  Strength/MMT:  LE MMT:  Pelvic floor:  External Exam: Introitus Appears: WNL Skin integrity: Pt. Has small opening in skin at posterior fourchette where postnatal healing was incomplete. Palpation: mild TTP to B STP Cough: paradoxical  Prolapse visible?: no Scar mobility: slightly decreased  Internal Vaginal Exam: Strength (PERF): 3+/5 Symmetry: L>R for spasm/pain Palpation: TTP to all major regions B with greatest at L anterior PR/PC and at coccygeus. Prolapse: with valsalva, anterior wall visible ~  1 cm above introitus   Abdominal:   Palpation: TTP to L distal adductors  Gait Analysis:  INTERVENTIONS THIS SESSION: Manual: Performed PFM assessment and TP release to all major muscle groups internally to decrease spasm and pain and allow for improved balance of musculature for improved function and decreased dyspareunia and SUI.  Therex: Educated on how to perform gentle anti-kegels to maintain full ROM and control over the PFM. Educated briefly on pre-squeeze and sneeze to  decrease SUI.  Total time: 55 min.                            PT Short Term Goals - 10/05/19 1318      PT SHORT TERM GOAL #1   Title  Patient will demonstrate a coordinated contraction, relaxation, and bulge of the pelvic floor muscles to demonstrate functional recruitment and motion and allow for further strengthening.    Baseline  Pt history suggests poor PFM coordination, breathing dysfunction noted    Time  5    Period  Weeks    Status  New    Target Date  11/09/19      PT SHORT TERM GOAL #2   Title  Patient will demonstrate improved pelvic alignment and balance of musculature surrounding the pelvis to facilitate decreased PFM spasms and decrease pelvic pain.    Baseline  L up-slip, R anterior rotation, spasms surrounding pelvis, LLD, L scoliosis    Time  5    Period  Weeks    Status  New    Target Date  11/09/19      PT SHORT TERM GOAL #3   Title  Patient will demonstrate HEP x1 in the clinic to demonstrate understanding and proper form to allow for further improvement.    Baseline  Pt. lacks knowledge of therapeutic exercises to decrease pain, spasm, and UI    Time  5    Period  Weeks    Status  New    Target Date  11/09/19      PT SHORT TERM GOAL #4   Title  Pt. will be compliant with wearing heel-lift during ~90% of the day while being active to help prevent return of spasms and malalignment.    Baseline  Pt. not using heel-lift, going to chiropractor monthly for adjustment.    Time  5    Period  Weeks    Status  New    Target Date  11/09/19        PT Long Term Goals - 10/05/19 1325      PT LONG TERM GOAL #1   Title  Patient will report no episodes of SUI over the course of the prior two weeks to demonstrate improved functional ability.    Baseline  Pt. having leakage with cough, jump, etc. but full emptying with vomiting (GERD causes her to vomit ~ every month)    Time  10    Period  Weeks    Status  New    Target Date  12/14/19       PT LONG TERM GOAL #2   Title  Patient will report no pain with intercourse to demonstrate improved functional ability.    Baseline  Pt. having pain with deep thrusting    Time  10    Period  Weeks    Status  New    Target Date  12/14/19      PT LONG TERM GOAL #3  Title  Patient will describe pain no greater than 2/10 following 30+ min. of moderate level exercise to demonstrate improved functional ability.    Baseline  Pt. has stopped exercising regularly due to increased pain and SUI. Max pain over last week 5/10    Time  10    Period  Weeks    Status  New    Target Date  12/14/19      PT LONG TERM GOAL #4   Title  Patient will score less than or equal to 30% on the Female NIH-CPSI and 10% on the VQ to demonstrate a reduction in pain, urinary symptoms, and an improved quality of life.    Baseline  VQ; 7/33 (21%), Female NIH-CPSI: 27/43 (63%)    Time  10    Period  Weeks    Status  New    Target Date  12/14/19            Plan - 11/04/19 0801    Clinical Impression Statement  Pt. Responded well to all interventions today, demonstrating decreased PFM spasms and improved ROM  as well as understanding and correct performance of all education and exercises provided today. They will continue to benefit from skilled physical therapy to work toward remaining goals and maximize function as well as decrease likelihood of symptom increase or recurrence.     PT Next Visit Plan  Ask about bladder and pain with intercourse since internal releaset, further treatment internally PRN (reassess strength). give handout on pre-squeeze and sneeze PRN, give deep-core stability and childs pose.Ionto for tendonosis?    PT Home Exercise Plan  diaphragmatic breathing, side-stretch, heel lift on R, R chair side-plank, bow-and-arrow       Patient will benefit from skilled therapeutic intervention in order to improve the following deficits and impairments:     Visit Diagnosis: Other idiopathic  scoliosis, thoracolumbar region  Other muscle spasm  Abnormal posture     Problem List There are no problems to display for this patient.  Willa Rough DPT, ATC Willa Rough 11/04/2019, 8:20 AM  [place] MAIN [facility] SERVICES [address] [place], [place], [address] Phone: [phone]   Fax:  [phone]  Name: ELYANNA WALLICK MRN: [id number] Date of Birth: [birth year]

## 2019-11-03 NOTE — Patient Instructions (Signed)
Kegel exercises:    With neutral spine, tighten pelvic floor by imagining you are stopping the flow of urine, squeezing only around the vagina and anus.  Quick-Flicks: Pull up and in as you exhale, and then relax all the way on an inhale, allowing enough time for the muscles to full lengthen before the next contraction. Do _10__ repetitions in a row, stopping if the pelvic floor muscle gets tired and other muscles try to take over.  If this is difficult, start by performing while lying down as shown. As you get stronger, you can try in sitting, standing, etc. Make sure that you are NOT contracting your buttocks or inner thigh muscles!  Tip: If you are having a hard time feeling the muscles, try using a hand-held mirror to observe the muscles lift and draw in or feel with your hand to make sure you are squeezing when you think you are squeezing, not bearing down. Some people find it helpful to pretend you are "picking blueberries" with the vagina to feel the drawing in or try to make the clitoris "nod" and pull your sitz bones in toward the middle.

## 2019-11-10 ENCOUNTER — Ambulatory Visit: Payer: BC Managed Care – PPO

## 2019-11-10 ENCOUNTER — Other Ambulatory Visit: Payer: Self-pay

## 2019-11-10 DIAGNOSIS — M4125 Other idiopathic scoliosis, thoracolumbar region: Secondary | ICD-10-CM | POA: Diagnosis not present

## 2019-11-10 DIAGNOSIS — M62838 Other muscle spasm: Secondary | ICD-10-CM

## 2019-11-10 DIAGNOSIS — R293 Abnormal posture: Secondary | ICD-10-CM

## 2019-11-10 NOTE — Therapy (Signed)
[facility] MAIN [facility] SERVICES [address] [place], [place], [address] Phone: [phone]   Fax:  [phone]  Physical Therapy Treatment  The patient has been informed of current processes in place at Outpatient Rehab to protect patients from Covid-19 exposure including social distancing, schedule modifications, and new cleaning procedures. After discussing their particular risk with a therapist based on the patient's personal risk factors, the patient has decided to proceed with in-person therapy.   Patient Details  Name: Alice Turner MRN: [id number] Date of Birth: 06-Oct-1971 Referring Provider (PT): Christeen Douglas   Encounter Date: 11/10/2019  PT End of Session - 11/11/19 1327    Visit Number  6    Number of Visits  10    Date for PT Re-Evaluation  12/14/19    Authorization Type  BCBS/[employer]    Authorization Time Period  12/05/2018 through 12/14/2019    Authorization - Visit Number  6    Authorization - Number of Visits  10    PT Start Time  1630    PT Stop Time  1730    PT Time Calculation (min)  60 min    Activity Tolerance  Patient tolerated treatment well;No increased pain    Behavior During Therapy  WFL for tasks assessed/performed       Past Medical History:  Diagnosis Date  . Anemia    H/O  . GERD (gastroesophageal reflux disease)     Past Surgical History:  Procedure Laterality Date  . COLONOSCOPY    . CYST EXCISION     vocal cord  . SEPTOPLASTY N/A 09/17/2016   Procedure: REPAIR OF SEPTAL PERFORATION;  Surgeon: Geanie Logan, MD;  Location: [facility];  Service: ENT;  Laterality: N/A;    There were no vitals filed for this visit.   Pelvic Floor Physical Therapy Treatment Note  SCREENING  Changes in medications, allergies, or medical history?: none    SUBJECTIVE  Patient reports: Has not seen a difference with leakage.  Still some pain with intercourse, attempted twice. Not having her normal back  pain and has not been back to the chiropractor.  Precautions:  none  Pain update:  Location of pain: R elbow Current pain: 0/10  Max pain: 5/10 Least pain: 0/10 Nature of pain: achyness with sharp burst if she tries to pick up something heavy, into her fingers.   Patient Goals: Improve Bladder control and decrease pain with intercourse.   OBJECTIVE  Changes in: Posture/Observations:  ASIS and PSIS level in standing with heel-lift. L lumbar scoliosis noted (from prior session)  Range of Motion/Flexibilty:  Decreased L >R thoracolumbar rotation in side-lying with some pain at ~ 30% of normal  ROM and to L,, only discomfort with overpressure in R rot at ~ 50% normal ROM (from previous session)  Strength/MMT:  LE MMT:  Pelvic floor: (From 11/03/19:) External Exam: Introitus Appears: WNL Skin integrity: Pt. Has small opening in skin at posterior fourchette where postnatal healing was incomplete. Palpation: mild TTP to B STP Cough: paradoxical  Prolapse visible?: no Scar mobility: slightly decreased  Internal Vaginal Exam: Strength (PERF): 3+/5 Symmetry: L>R for spasm/pain Palpation: TTP to all major regions B with greatest at L anterior PR/PC and at coccygeus. Prolapse: with valsalva, anterior wall visible ~ 1 cm above introitus   Abdominal:   Palpation: TTP to piriformis and Coccygeus B.  Gait Analysis:  INTERVENTIONS THIS SESSION: Manual: Performed PA to all sacral borders to improve mobility of joint  and surrounding connective tissue and decrease pressure on nerve roots for improved conductivity and function of down-stream tissues.Ffollowed with cupping to L SIJ due to this area not responding to mobilizations and radiating pain into RLE, TP release to piriformis and Coccygeus B to allow for decreased pain,spasm, and pressure on nerve roots that innervate the pelvic region..  Therex: Educated on and practiced seated piriformis stretch to maintain decreased  tension and improved position of sacrum for decreased pain and spasm.  Total time: 60 min.                            PT Short Term Goals - 10/05/19 1318      PT SHORT TERM GOAL #1   Title  Patient will demonstrate a coordinated contraction, relaxation, and bulge of the pelvic floor muscles to demonstrate functional recruitment and motion and allow for further strengthening.    Baseline  Pt history suggests poor PFM coordination, breathing dysfunction noted    Time  5    Period  Weeks    Status  New    Target Date  11/09/19      PT SHORT TERM GOAL #2   Title  Patient will demonstrate improved pelvic alignment and balance of musculature surrounding the pelvis to facilitate decreased PFM spasms and decrease pelvic pain.    Baseline  L up-slip, R anterior rotation, spasms surrounding pelvis, LLD, L scoliosis    Time  5    Period  Weeks    Status  New    Target Date  11/09/19      PT SHORT TERM GOAL #3   Title  Patient will demonstrate HEP x1 in the clinic to demonstrate understanding and proper form to allow for further improvement.    Baseline  Pt. lacks knowledge of therapeutic exercises to decrease pain, spasm, and UI    Time  5    Period  Weeks    Status  New    Target Date  11/09/19      PT SHORT TERM GOAL #4   Title  Pt. will be compliant with wearing heel-lift during ~90% of the day while being active to help prevent return of spasms and malalignment.    Baseline  Pt. not using heel-lift, going to chiropractor monthly for adjustment.    Time  5    Period  Weeks    Status  New    Target Date  11/09/19        PT Long Term Goals - 10/05/19 1325      PT LONG TERM GOAL #1   Title  Patient will report no episodes of SUI over the course of the prior two weeks to demonstrate improved functional ability.    Baseline  Pt. having leakage with cough, jump, etc. but full emptying with vomiting (GERD causes her to vomit ~ every month)    Time  10     Period  Weeks    Status  New    Target Date  12/14/19      PT LONG TERM GOAL #2   Title  Patient will report no pain with intercourse to demonstrate improved functional ability.    Baseline  Pt. having pain with deep thrusting    Time  10    Period  Weeks    Status  New    Target Date  12/14/19      PT LONG TERM GOAL #3  Title  Patient will describe pain no greater than 2/10 following 30+ min. of moderate level exercise to demonstrate improved functional ability.    Baseline  Pt. has stopped exercising regularly due to increased pain and SUI. Max pain over last week 5/10    Time  10    Period  Weeks    Status  New    Target Date  12/14/19      PT LONG TERM GOAL #4   Title  Patient will score less than or equal to 30% on the Female NIH-CPSI and 10% on the VQ to demonstrate a reduction in pain, urinary symptoms, and an improved quality of life.    Baseline  VQ; 7/33 (21%), Female NIH-CPSI: 27/43 (63%)    Time  10    Period  Weeks    Status  New    Target Date  12/14/19            Plan - 11/11/19 1328    Clinical Impression Statement  Pt. Responded well to all interventions today, demonstrating improved sacral mobility and decreased spasm and pain, as well as understanding and correct performance of all education and exercises provided today. They will continue to benefit from skilled physical therapy to work toward remaining goals and maximize function as well as decrease likelihood of symptom increase or recurrence.     PT Next Visit Plan  Ask about bladder and pain with intercourse since sacral mobility, further treatment internally PRN (reassess strength). give handout on pre-squeeze and sneeze PRN, give deep-core stability and childs pose.Ionto for tendonosis?    PT Home Exercise Plan  diaphragmatic breathing, side-stretch, heel lift on R, R chair side-plank, bow-and-arrow, piriformis stretch in seated    Consulted and Agree with Plan of Care  Patient       Patient will  benefit from skilled therapeutic intervention in order to improve the following deficits and impairments:     Visit Diagnosis: Other idiopathic scoliosis, thoracolumbar region  Other muscle spasm  Abnormal posture     Problem List There are no problems to display for this patient.  Willa Rough DPT, ATC Willa Rough 11/11/2019, 1:30 PM  [facility] MAIN [facility] SERVICES [address] [place], [place], [address] Phone: [phone]   Fax:  [phone]  Name: Alice Turner MRN: [id number] Date of Birth: 1971-08-31

## 2019-11-10 NOTE — Patient Instructions (Signed)
    Sit up with a tall spine and cross one leg over the other knee. Hinge from the hip and lean until you can feel a stretch through your bottom hold for 5 deep breaths and then switch sides. Repeat 2-3 times on each side.   

## 2019-11-17 ENCOUNTER — Other Ambulatory Visit: Payer: Self-pay

## 2019-11-17 ENCOUNTER — Ambulatory Visit: Payer: BC Managed Care – PPO

## 2019-11-17 DIAGNOSIS — M62838 Other muscle spasm: Secondary | ICD-10-CM

## 2019-11-17 DIAGNOSIS — M4125 Other idiopathic scoliosis, thoracolumbar region: Secondary | ICD-10-CM

## 2019-11-17 DIAGNOSIS — R293 Abnormal posture: Secondary | ICD-10-CM

## 2019-11-17 NOTE — Patient Instructions (Addendum)
3-Way Wall Stretches for Pelvic Floor Lengthening   Bring bottom close to the wall and gently press straighten knees to feel a stretch down the back of you thighs. Hold while taking 5 deep belly breaths and feeling the pelvic floor relax and lower on each inhale.    Let your legs fall to the side to feel a stretch on the inside of your thighs. If the stretch is too intense you can use a pillow to take some of the weight off by wedging it on the outside of your hips. Hold while taking 5 deep belly breaths and feeling the pelvic floor relax and lower on each inhale.    Slide feet down the wall and move hips slightly away from the wall and then let your knees fall to the sides so you feel a stretch on the inside of the thighs near your groin. Hold while taking 5 deep belly breaths and feeling the pelvic floor relax and lower on each inhale.     *Perform each stretch in sequence 3 times, once a day  Hip Abduction (Standing)    Stand with support. Squeeze deep core and hold. Start with leg to the side/back on your toe and gently squeeze the outer hip to lift the toe off of the ground. Hold for 1 second then repeat. You should feel this in the outer hip, not the front, not your side,  And not your back. Repeat __10_ times. Do _3__ sets  a day.  HIP Extension - Standing    Stand with support. Squeeze deep core and hold. Start with leg to the back on your toe and gently squeeze the buttock to lift the toe off of the ground. Hold for 1 second then repeat. You should feel this in the outer hip, not the front, not your side,  And not your back. Repeat __10_ times. Do _3__ sets  a day.    Keep your trunk as one unit and let it hinge forward from the hips as you push your bottom back and bend your knees at the same rate that you bend your hips. Keep your weight back toward your heels but do not actually lift the toes off the ground. Exhale starting just before and all the way through standing to help  engage the glutes and lower tummy muscles.   Tuck the pelvis under and hold as you reach for the floor with alternating heels. Do 2x15 with L&R together=1      This is The QL muscle  To perform release on this muscle, start by getting into this position by bridging the hips up and then slowly lowering your back, then your butt down to lengthen the low back then put the ball under you where you feel the tender spot and roll to the same side slightly to add pressure as needed. Hold still and take deep breaths until the pain is at least 50% less or, ideally, just pressure.   This is your piriformis    To release this muscle start in this position with your ankle crossed over the opposite knee. Place the tennis ball under your buttock where the tender spot is and then slightly roll your weight to the same side to put just enough pressure that it is uncomfortable. Hold and take deep breaths until the pain is at least 50% less or, ideally ,just pressure.   This is your Gluteus Medius and Minimus  To perform release on this muscle, start by getting  into this position by bridging the hips up and then slowly lowering your back, then your butt down to lengthen the low back then put the ball under you where you feel the tender spot and roll to the same side slightly to add pressure as needed. Hold still and take deep breaths until the pain is at least 50% less or, ideally, just pressure.   These are your deep hip-flexor muscles. They are easiest to reach where they come together at the hip.   To perform release on this muscle, start by getting into this position by laying on your stomach then put the ball under you where you feel the tender spot and bring the opposite knee up/out to the side to add pressure as needed. Hold still and take deep breaths until the pain is at least 50% less or, ideally ,just pressure.

## 2019-11-17 NOTE — Therapy (Signed)
[facility] MAIN [facility] SERVICES [address] [place], [place], [address] Phone: [phone]   Fax:  [phone]  Physical Therapy Treatment  The patient has been informed of current processes in place at Outpatient Rehab to protect patients from Covid-19 exposure including social distancing, schedule modifications, and new cleaning procedures. After discussing their particular risk with a therapist based on the patient's personal risk factors, the patient has decided to proceed with in-person therapy.   Patient Details  Name: Alice Turner MRN: [id number] Date of Birth: 16-Jul-1971 Referring Provider (PT): Christeen Douglas   Encounter Date: 11/17/2019  PT End of Session - 11/18/19 1259    Visit Number  7    Number of Visits  10    Date for PT Re-Evaluation  12/14/19    Authorization Type  BCBS/[employer]HC    Authorization Time Period  12/05/2018 through 12/14/2019    Authorization - Visit Number  7    Authorization - Number of Visits  10    PT Start Time  1735    PT Stop Time  1830    PT Time Calculation (min)  55 min    Activity Tolerance  Patient tolerated treatment well;No increased pain    Behavior During Therapy  WFL for tasks assessed/performed       Past Medical History:  Diagnosis Date  . Anemia    H/O  . GERD (gastroesophageal reflux disease)     Past Surgical History:  Procedure Laterality Date  . COLONOSCOPY    . CYST EXCISION     vocal cord  . SEPTOPLASTY N/A 09/17/2016   Procedure: REPAIR OF SEPTAL PERFORATION;  Surgeon: Geanie Logan, MD;  Location: [facility];  Service: ENT;  Laterality: N/A;    There were no vitals filed for this visit.   Pelvic Floor Physical Therapy Treatment Note  SCREENING  Changes in medications, allergies, or medical history?: none    SUBJECTIVE  Patient reports: No pain still. Having a period again after not having a period for a couple months and this one is more intense.  Pre-squeeze and sneeze helps decrease amount of leakage. Still having pain with intercourse.  Precautions:  none  Pain update:  Location of pain: R elbow Current pain: 0/10  Max pain: 5/10 Least pain: 0/10 Nature of pain: achyness with sharp burst if she tries to pick up something heavy, into her fingers.   Patient Goals: Improve Bladder control and decrease pain with intercourse.   OBJECTIVE  Changes in: Posture/Observations:  ASIS and PSIS level in standing with heel-lift. L lumbar scoliosis noted (from prior session)  Pt. Demonstrates decreased hip Extensor/ABD and TA recruitment with functional movements including squat and sit-up.  Range of Motion/Flexibilty:  Decreased L >R thoracolumbar rotation in side-lying with some pain at ~ 30% of normal  ROM and to L,, only discomfort with overpressure in R rot at ~ 50% normal ROM (from previous session)  Strength/MMT:  LE MMT:  Pelvic floor: (From 11/03/19:) External Exam: Introitus Appears: WNL Skin integrity: Pt. Has small opening in skin at posterior fourchette where postnatal healing was incomplete. Palpation: mild TTP to B STP Cough: paradoxical  Prolapse visible?: no Scar mobility: slightly decreased  Internal Vaginal Exam: Strength (PERF): 3+/5 Symmetry: L>R for spasm/pain Palpation: TTP to all major regions B with greatest at L anterior PR/PC and at coccygeus. Prolapse: with valsalva, anterior wall visible ~ 1 cm above introitus   Abdominal:   Palpation:  Gait Analysis:  INTERVENTIONS THIS SESSION: Therex: Educated on and practiced three way wall stretch To maintain and improve muscle length and allow for improved balance of musculature for long-term symptom relief. Educated on and practiced standing hip EXT and ABD, squats, and toe taps with emphasis on deep-core, glute, and Glute Med recruitment to improve exercise efficacy and improve muscular imbalances to decrease PFM spasm and dysfunction when  pelvic girdle is more effective.  Theract: educated on and practiced self TP release with a tennis ball to allow for down-regulation of lumbar extensors and allow for improved functional recruitment of glutes to improve strength of muscles opposing tight musculature to allow reciprocal inhibition to improve balance of musculature surrounding the pelvis and improve overall posture for optimal musculature length-tension relationship and function.    Total time: 60 min.                           PT Short Term Goals - 10/05/19 1318      PT SHORT TERM GOAL #1   Title  Patient will demonstrate a coordinated contraction, relaxation, and bulge of the pelvic floor muscles to demonstrate functional recruitment and motion and allow for further strengthening.    Baseline  Pt history suggests poor PFM coordination, breathing dysfunction noted    Time  5    Period  Weeks    Status  New    Target Date  11/09/19      PT SHORT TERM GOAL #2   Title  Patient will demonstrate improved pelvic alignment and balance of musculature surrounding the pelvis to facilitate decreased PFM spasms and decrease pelvic pain.    Baseline  L up-slip, R anterior rotation, spasms surrounding pelvis, LLD, L scoliosis    Time  5    Period  Weeks    Status  New    Target Date  11/09/19      PT SHORT TERM GOAL #3   Title  Patient will demonstrate HEP x1 in the clinic to demonstrate understanding and proper form to allow for further improvement.    Baseline  Pt. lacks knowledge of therapeutic exercises to decrease pain, spasm, and UI    Time  5    Period  Weeks    Status  New    Target Date  11/09/19      PT SHORT TERM GOAL #4   Title  Pt. will be compliant with wearing heel-lift during ~90% of the day while being active to help prevent return of spasms and malalignment.    Baseline  Pt. not using heel-lift, going to chiropractor monthly for adjustment.    Time  5    Period  Weeks    Status  New     Target Date  11/09/19        PT Long Term Goals - 10/05/19 1325      PT LONG TERM GOAL #1   Title  Patient will report no episodes of SUI over the course of the prior two weeks to demonstrate improved functional ability.    Baseline  Pt. having leakage with cough, jump, etc. but full emptying with vomiting (GERD causes her to vomit ~ every month)    Time  10    Period  Weeks    Status  New    Target Date  12/14/19      PT LONG TERM GOAL #2   Title  Patient will report no pain with intercourse to demonstrate  improved functional ability.    Baseline  Pt. having pain with deep thrusting    Time  10    Period  Weeks    Status  New    Target Date  12/14/19      PT LONG TERM GOAL #3   Title  Patient will describe pain no greater than 2/10 following 30+ min. of moderate level exercise to demonstrate improved functional ability.    Baseline  Pt. has stopped exercising regularly due to increased pain and SUI. Max pain over last week 5/10    Time  10    Period  Weeks    Status  New    Target Date  12/14/19      PT LONG TERM GOAL #4   Title  Patient will score less than or equal to 30% on the Female NIH-CPSI and 10% on the VQ to demonstrate a reduction in pain, urinary symptoms, and an improved quality of life.    Baseline  VQ; 7/33 (21%), Female NIH-CPSI: 27/43 (63%)    Time  10    Period  Weeks    Status  New    Target Date  12/14/19            Plan - 11/18/19 1259    Clinical Impression Statement  Pt. Responded well to all interventions today, demonstrating improved awareness of improved body mechanics and better recruitment of muscles that are less active, as well as understanding and correct performance of all education and exercises provided today. They will continue to benefit from skilled physical therapy to work toward remaining goals and maximize function as well as decrease likelihood of symptom increase or recurrence.     PT Next Visit Plan  Ask about bladder and  pain with intercourse since sacral mobility, further treatment internally PRN (reassess strength). give handout on pre-squeeze and sneeze PRN, give deep-core stability and childs pose.Ionto for tendonosis?    PT Home Exercise Plan  diaphragmatic breathing, side-stretch, heel lift on R, R chair side-plank, bow-and-arrow, piriformis stretch in seated    Consulted and Agree with Plan of Care  Patient       Patient will benefit from skilled therapeutic intervention in order to improve the following deficits and impairments:     Visit Diagnosis: Other idiopathic scoliosis, thoracolumbar region  Other muscle spasm  Abnormal posture     Problem List There are no problems to display for this patient.  Cleophus Molt DPT, ATC Cleophus Molt 11/18/2019, 1:00 PM  [facility] MAIN [facility] SERVICES [address] [place], [place], [address] Phone: [phone]   Fax:  [phone]  Name: Alice Turner MRN: [id number] Date of Birth: 1971/01/18

## 2019-11-24 ENCOUNTER — Other Ambulatory Visit: Payer: Self-pay

## 2019-11-24 ENCOUNTER — Ambulatory Visit: Payer: BC Managed Care – PPO | Attending: Obstetrics and Gynecology

## 2019-11-24 DIAGNOSIS — M4125 Other idiopathic scoliosis, thoracolumbar region: Secondary | ICD-10-CM | POA: Diagnosis present

## 2019-11-24 DIAGNOSIS — M62838 Other muscle spasm: Secondary | ICD-10-CM | POA: Diagnosis present

## 2019-11-24 DIAGNOSIS — R293 Abnormal posture: Secondary | ICD-10-CM

## 2019-11-24 NOTE — Therapy (Addendum)
[place] [facility] MAIN [facility] SERVICES [address] [place], [place], [address] Phone: [phone]   Fax:  [phone]  Physical Therapy Treatment  The patient has been informed of current processes in place at Outpatient Rehab to protect patients from Covid-19 exposure including social distancing, schedule modifications, and new cleaning procedures. After discussing their particular risk with a therapist based on the patient's personal risk factors, the patient has decided to proceed with in-person therapy.   Patient Details  Name: Alice Turner MRN: [id number] Date of Birth: Jul 23, 1971 Referring Provider (PT): Christeen Douglas   Encounter Date: 11/24/2019  PT End of Session - 11/30/19 1650    Visit Number  8    Number of Visits  10    Date for PT Re-Evaluation  12/14/19    Authorization Type  BCBS/[employer]    Authorization Time Period  12/05/2018 through 12/14/2019    Authorization - Visit Number  8    Authorization - Number of Visits  10    PT Start Time  1740    PT Stop Time  1835    PT Time Calculation (min)  55 min    Activity Tolerance  Patient tolerated treatment well;No increased pain    Behavior During Therapy  WFL for tasks assessed/performed       Past Medical History:  Diagnosis Date  . Anemia    H/O  . GERD (gastroesophageal reflux disease)     Past Surgical History:  Procedure Laterality Date  . COLONOSCOPY    . CYST EXCISION     vocal cord  . SEPTOPLASTY N/A 09/17/2016   Procedure: REPAIR OF SEPTAL PERFORATION;  Surgeon: Geanie Logan, MD;  Location: [facility];  Service: ENT;  Laterality: N/A;    There were no vitals filed for this visit.  Pelvic Floor Physical Therapy Treatment Note  SCREENING  Changes in medications, allergies, or medical history?: none    SUBJECTIVE  Patient reports: Had a coughing fit in the car and did not have any leakage. Slight discomfort in the L side from exercising. Also was  sitting in a chair with the left knee bent up to her chest. Still pain with intercourse.  Precautions:  none  Pain update:  Location of pain: R elbow Current pain: 0/10  Max pain: 5/10 Least pain: 0/10 Nature of pain: achyness with sharp burst if she tries to pick up something heavy, into her fingers.   Patient Goals: Improve Bladder control and decrease pain with intercourse.   OBJECTIVE  Changes in: Posture/Observations:  WNL  Range of Motion/Flexibilty:  Decreased L >R thoracolumbar rotation in side-lying with some pain at ~ 30% of normal  ROM and to L,, only discomfort with overpressure in R rot at ~ 50% normal ROM (from previous session)  Strength/MMT:  LE MMT:  Pelvic floor: (From 11/03/19:) External Exam: Introitus Appears: WNL Skin integrity: Pt. Has small opening in skin at posterior fourchette where postnatal healing was incomplete. Palpation: mild TTP to B STP Cough: paradoxical  Prolapse visible?: no Scar mobility: slightly decreased  Internal Vaginal Exam: Strength (PERF): 3+/5 Symmetry: L>R for spasm/pain Palpation: TTP to all major regions B with greatest at L anterior PR/PC and at coccygeus. Prolapse: with valsalva, anterior wall visible ~ 1 cm above introitus  Today: greatest restriction found at L posterior fourchette where scar tissue is present. Scar mobility and spasms decreased slowly but seemingly fully following treatment.   Abdominal:   Palpation:  Gait Analysis:  INTERVENTIONS THIS SESSION: Manual: TP release and scar release to L posterior fourchette to decrease pain with intercourse and allow for decreased pressure on nerve bundle in the posterior fourchette.   Total time: 60 min.                                PT Short Term Goals - 10/05/19 1318      PT SHORT TERM GOAL #1   Title  Patient will demonstrate a coordinated contraction, relaxation, and bulge of the pelvic floor muscles to  demonstrate functional recruitment and motion and allow for further strengthening.    Baseline  Pt history suggests poor PFM coordination, breathing dysfunction noted    Time  5    Period  Weeks    Status  New    Target Date  11/09/19      PT SHORT TERM GOAL #2   Title  Patient will demonstrate improved pelvic alignment and balance of musculature surrounding the pelvis to facilitate decreased PFM spasms and decrease pelvic pain.    Baseline  L up-slip, R anterior rotation, spasms surrounding pelvis, LLD, L scoliosis    Time  5    Period  Weeks    Status  New    Target Date  11/09/19      PT SHORT TERM GOAL #3   Title  Patient will demonstrate HEP x1 in the clinic to demonstrate understanding and proper form to allow for further improvement.    Baseline  Pt. lacks knowledge of therapeutic exercises to decrease pain, spasm, and UI    Time  5    Period  Weeks    Status  New    Target Date  11/09/19      PT SHORT TERM GOAL #4   Title  Pt. will be compliant with wearing heel-lift during ~90% of the day while being active to help prevent return of spasms and malalignment.    Baseline  Pt. not using heel-lift, going to chiropractor monthly for adjustment.    Time  5    Period  Weeks    Status  New    Target Date  11/09/19        PT Long Term Goals - 10/05/19 1325      PT LONG TERM GOAL #1   Title  Patient will report no episodes of SUI over the course of the prior two weeks to demonstrate improved functional ability.    Baseline  Pt. having leakage with cough, jump, etc. but full emptying with vomiting (GERD causes her to vomit ~ every month)    Time  10    Period  Weeks    Status  New    Target Date  12/14/19      PT LONG TERM GOAL #2   Title  Patient will report no pain with intercourse to demonstrate improved functional ability.    Baseline  Pt. having pain with deep thrusting    Time  10    Period  Weeks    Status  New    Target Date  12/14/19      PT LONG TERM GOAL  #3   Title  Patient will describe pain no greater than 2/10 following 30+ min. of moderate level exercise to demonstrate improved functional ability.    Baseline  Pt. has stopped exercising regularly due to increased pain and SUI. Max pain over last week 5/10  Time  10    Period  Weeks    Status  New    Target Date  12/14/19      PT LONG TERM GOAL #4   Title  Patient will score less than or equal to 30% on the Female NIH-CPSI and 10% on the VQ to demonstrate a reduction in pain, urinary symptoms, and an improved quality of life.    Baseline  VQ; 7/33 (21%), Female NIH-CPSI: 27/43 (63%)    Time  10    Period  Weeks    Status  New    Target Date  12/14/19            Plan - 11/30/19 1650    Clinical Impression Statement  Pt. Responded well to all interventions today, demonstrating improved scar mobility in the perineum and decreased spasm and pain as well as understanding and correct performance of all education and exercises provided today. They will continue to benefit from skilled physical therapy to work toward remaining goals and maximize function as well as decrease likelihood of symptom increase or recurrence.    PT Next Visit Plan  Ask about bladder and pain with intercourse since sacral mobility, further treatment internally PRN (reassess strength). give handout on pre-squeeze and sneeze PRN, give deep-core stability and childs pose.Ionto for tendonosis?    PT Home Exercise Plan  diaphragmatic breathing, side-stretch, heel lift on R, R chair side-plank, bow-and-arrow, piriformis stretch in seated    Consulted and Agree with Plan of Care  Patient       Patient will benefit from skilled therapeutic intervention in order to improve the following deficits and impairments:     Visit Diagnosis: Other idiopathic scoliosis, thoracolumbar region  Other muscle spasm  Abnormal posture     Problem List There are no problems to display for this patient.  Cleophus Molt DPT,  ATC Cleophus Molt 11/30/2019, 4:54 PM  [place] [facility] MAIN [facility] SERVICES [address] [place], [place], [address] Phone: [phone]   Fax:  [phone]  Name: Alice Turner MRN: [id number] Date of Birth: 07-20-1971

## 2019-12-01 ENCOUNTER — Ambulatory Visit: Payer: BC Managed Care – PPO

## 2019-12-01 ENCOUNTER — Other Ambulatory Visit: Payer: Self-pay

## 2019-12-01 DIAGNOSIS — M4125 Other idiopathic scoliosis, thoracolumbar region: Secondary | ICD-10-CM

## 2019-12-01 DIAGNOSIS — M62838 Other muscle spasm: Secondary | ICD-10-CM

## 2019-12-01 DIAGNOSIS — R293 Abnormal posture: Secondary | ICD-10-CM

## 2019-12-01 NOTE — Patient Instructions (Signed)
Exercises to keep doing forever:  side-stretch, R chair side-plank, bow-and-arrow, tea-cups  Do these ~ 3 times per week to maintain alignment and prevent worsening scoliosis. Keep wearing your    Optional/As needed: piriformis stretch in seated, 3-way wall stretch

## 2019-12-01 NOTE — Therapy (Signed)
[place] [facility] MAIN [facility] SERVICES [address] [place], [place], [address] Phone: [phone]   Fax:  [phone]  Physical Therapy Treatment  The patient has been informed of current processes in place at Outpatient Rehab to protect patients from Covid-19 exposure including social distancing, schedule modifications, and new cleaning procedures. After discussing their particular risk with a therapist based on the patient's personal risk factors, the patient has decided to proceed with in-person therapy.   Patient Details  Name: Alice Turner MRN: [id number] Date of Birth: [birth year] Referring Provider (PT): Christeen Douglas   Encounter Date: 12/01/2019  PT End of Session - 12/02/19 0840    Visit Number  9    Number of Visits  10    Date for PT Re-Evaluation  12/14/19    Authorization Type  BCBS/UHC    Authorization Time Period  12/05/2018 through 12/14/2019    Authorization - Visit Number  9    Authorization - Number of Visits  10    PT Start Time  1730    PT Stop Time  1830    PT Time Calculation (min)  60 min    Activity Tolerance  Patient tolerated treatment well;No increased pain    Behavior During Therapy  WFL for tasks assessed/performed       Past Medical History:  Diagnosis Date  . Anemia    H/O  . GERD (gastroesophageal reflux disease)     Past Surgical History:  Procedure Laterality Date  . COLONOSCOPY    . CYST EXCISION     vocal cord  . SEPTOPLASTY N/A 09/17/2016   Procedure: REPAIR OF SEPTAL PERFORATION;  Surgeon: Geanie Logan, MD;  Location: [facility];  Service: ENT;  Laterality: N/A;    There were no vitals filed for this visit.   Pelvic Floor Physical Therapy Treatment Note  SCREENING  Changes in medications, allergies, or medical history?: none    SUBJECTIVE  Patient reports: No issues, no pain with intercourse but did not "change positions" which is what normally causes it. Did 10 jumping jacks  at the end of her workout and did not dribble any!!! Had menstrual cramp feeling after last session but it went away and has felt good since. Has been able to go 2 months without doing her monthly "maintenance" at the chiropractor.  Precautions:  none  Pain update:  Location of pain: R elbow Current pain: 0/10  Max pain: 5/10 Least pain: 0/10 Nature of pain: achyness with sharp burst if she tries to pick up something heavy, into her fingers.   Patient Goals: Improve Bladder control and decrease pain with intercourse.   OBJECTIVE  Changes in: Posture/Observations:  L lumbar, R thoracic scoliosis (mild)   Range of Motion/Flexibilty:  Decreased L >R thoracolumbar rotation in side-lying with some pain at ~ 30% of normal  ROM and to L,, only discomfort with overpressure in R rot at ~ 50% normal ROM (from previous session)  Today: Pt was limited by pain by ~ 10 deg. B pre-treatment and was able to attain full pain-free ROM following treatment.    Strength/MMT:  LE MMT:  Pelvic floor: (From 11/03/19:) External Exam: Introitus Appears: WNL Skin integrity: Pt. Has small opening in skin at posterior fourchette where postnatal healing was incomplete. Palpation: mild TTP to B STP Cough: paradoxical  Prolapse visible?: no Scar mobility: slightly decreased  Internal Vaginal Exam: Strength (PERF): 3+/5 Symmetry: L>R for spasm/pain Palpation: TTP to all major regions B  with greatest at L anterior PR/PC and at coccygeus. Prolapse: with valsalva, anterior wall visible ~ 1 cm above introitus   Abdominal:  Pt. Is able to take about 50% of a full diaphragmatic breath with focus required pre-treatment.   -Able to easily fully take a diaphragmatic breath following treatment.  Palpation: TTP to B Multifidus and paraspinals at ~ L1   Gait Analysis:  INTERVENTIONS THIS SESSION: Manual: Performed TP release and STM to B Multifidus and paraspinals at ~ L1 to decrease spasm and  pain and allow for improved rotational ROM, decreased pressure on nerve roots, and balance of musculature for improved function and decreased symptoms.  Dry-needle: Performed TPDN with a .30x33mm needle and standard approach as described below to decrease spasm and pain and allow for improved rotational ROM, decreased pressure on nerve roots, and allow for improved balance of musculature for improved function and decreased symptoms.   Self-care: Educated on urge-suppression technique, urination norms for frequency, and how to improve bladder filling capacity to decrease urinary frequency.  Therex: Educated on and practiced tea-pot to improve upper part of scoliotic curve and educated on which exercises to continue with indefinitely at 3x/wk to maintain improved spinal alignment and decreased Sx.    Total time: 60 min.                              PT Short Term Goals - 12/01/19 1737      PT SHORT TERM GOAL #1   Title  Patient will demonstrate a coordinated contraction, relaxation, and bulge of the pelvic floor muscles to demonstrate functional recruitment and motion and allow for further strengthening.    Baseline  Pt history suggests poor PFM coordination, breathing dysfunction noted    Time  5    Period  Weeks    Status  Achieved    Target Date  11/09/19      PT SHORT TERM GOAL #2   Title  Patient will demonstrate improved pelvic alignment and balance of musculature surrounding the pelvis to facilitate decreased PFM spasms and decrease pelvic pain.    Baseline  L up-slip, R anterior rotation, spasms surrounding pelvis, LLD, L scoliosis    Time  5    Period  Weeks    Status  Achieved    Target Date  11/09/19      PT SHORT TERM GOAL #3   Title  Patient will demonstrate HEP x1 in the clinic to demonstrate understanding and proper form to allow for further improvement.    Baseline  Pt. lacks knowledge of therapeutic exercises to decrease pain, spasm, and UI     Time  5    Period  Weeks    Status  Achieved    Target Date  11/09/19      PT SHORT TERM GOAL #4   Title  Pt. will be compliant with wearing heel-lift during ~90% of the day while being active to help prevent return of spasms and malalignment.    Baseline  Pt. not using heel-lift, going to chiropractor monthly for adjustment.    Time  5    Period  Weeks    Status  Achieved    Target Date  11/09/19        PT Long Term Goals - 12/01/19 1744      PT LONG TERM GOAL #1   Title  Patient will report no episodes of SUI over the course of  the prior two weeks to demonstrate improved functional ability.    Baseline  Pt. having leakage with cough, jump, etc. but full emptying with vomiting (GERD causes her to vomit ~ every month)    Time  10    Period  Weeks    Status  Achieved    Target Date  12/14/19      PT LONG TERM GOAL #2   Title  Patient will report no pain with intercourse to demonstrate improved functional ability.    Baseline  Pt. having pain with deep thrusting    Time  10    Period  Weeks    Status  On-going    Target Date  12/14/19      PT LONG TERM GOAL #3   Title  Patient will describe pain no greater than 2/10 following 30+ min. of moderate level exercise to demonstrate improved functional ability.    Baseline  Pt. has stopped exercising regularly due to increased pain and SUI. Max pain over last week 5/10    Time  10    Period  Weeks    Status  Achieved    Target Date  12/14/19      PT LONG TERM GOAL #4   Title  Patient will score less than or equal to 30% on the Female NIH-CPSI and 10% on the VQ to demonstrate a reduction in pain, urinary symptoms, and an improved quality of life.    Baseline  VQ; 7/33 (21%), Female NIH-CPSI: 27/43 (63%) As of 2/10: Female NIH-CPSI: 8/43 (18%) and VQ: 4/33 (12%)    Time  10    Period  Weeks    Status  On-going    Target Date  12/14/19            Plan - 12/02/19 0841    Clinical Impression Statement  Pt. responded very  well to treatment today, demonstrating full thoracolumbar rotation B without pain following treatment, improved ability to take a diaphragmatic breath, and PFM relaxation evidenced by involuntary flatulance during treatment. She also demonstrated understanding of new exercise and educationa as well as info on how to maintain improvement long-term. She will benefit from at least one more session to determine if this translates to decreased pain with intercourse and decreased urinary frequency, her only two remaining complaints.    PT Next Visit Plan  review bladder diary, ask about intercourse, give written urge-suppression infom re-check PFM? D/C if appropriate    PT Home Exercise Plan  diaphragmatic breathing, side-stretch, heel lift on R, R chair side-plank, bow-and-arrow, piriformis stretch in seated, tea-pot    Consulted and Agree with Plan of Care  Patient       Patient will benefit from skilled therapeutic intervention in order to improve the following deficits and impairments:     Visit Diagnosis: Other idiopathic scoliosis, thoracolumbar region  Other muscle spasm  Abnormal posture     Problem List There are no problems to display for this patient.  Alice Turner DPT, ATC Alice Turner 12/02/2019, 8:56 AM  [place] MAIN [facility] SERVICES [address] [place], [place], [address] Phone: [phone]   Fax:  [phone]  Name: Alice Turner MRN: [id number] Date of Birth: 22-Sep-1971

## 2019-12-08 ENCOUNTER — Other Ambulatory Visit: Payer: Self-pay

## 2019-12-08 ENCOUNTER — Ambulatory Visit: Payer: BC Managed Care – PPO

## 2019-12-08 DIAGNOSIS — R293 Abnormal posture: Secondary | ICD-10-CM

## 2019-12-08 DIAGNOSIS — M4125 Other idiopathic scoliosis, thoracolumbar region: Secondary | ICD-10-CM | POA: Diagnosis not present

## 2019-12-08 DIAGNOSIS — M62838 Other muscle spasm: Secondary | ICD-10-CM

## 2019-12-08 NOTE — Therapy (Signed)
[place] MAIN [facility] SERVICES [address] [place], [place], [address] Phone: [phone]   Fax:  [phone]  Physical Therapy Treatment  The patient has been informed of current processes in place at Outpatient Rehab to protect patients from Covid-19 exposure including social distancing, schedule modifications, and new cleaning procedures. After discussing their particular risk with a therapist based on the patient's personal risk factors, the patient has decided to proceed with in-person therapy.   Patient Details  Name: Alice Turner MRN: [id number] Date of Birth: [birth year] Referring Provider (PT): Benjaman Kindler   Encounter Date: 12/08/2019  PT End of Session - 12/08/19 1259    Visit Number  10    Number of Visits  10    Date for PT Re-Evaluation  12/14/19    Authorization Type  BCBS/UHC    Authorization Time Period  12/05/2018 through 12/14/2019    Authorization - Visit Number  10    Authorization - Number of Visits  10    Activity Tolerance  Patient tolerated treatment well;No increased pain    Behavior During Therapy  WFL for tasks assessed/performed       Past Medical History:  Diagnosis Date  . Anemia    H/O  . GERD (gastroesophageal reflux disease)     Past Surgical History:  Procedure Laterality Date  . COLONOSCOPY    . CYST EXCISION     vocal cord  . SEPTOPLASTY N/A 09/17/2016   Procedure: REPAIR OF SEPTAL PERFORATION;  Surgeon: Clyde Canterbury, MD;  Location: [place];  Service: ENT;  Laterality: N/A;    There were no vitals filed for this visit.    Pelvic Floor Physical Therapy Treatment Note  SCREENING  Changes in medications, allergies, or medical history?: none    SUBJECTIVE  Patient reports: Did try intercourse, still had discomfort with deep penetration though somewhat less intense. Brought her bladder diary. Started sneezing when she had been holding her bladder for a little while with no  leakage. Jumping jacks again did not cause an issue.  Precautions:  none  Pain update:  Location of pain: R elbow Current pain: 0/10  Max pain: 5/10 Least pain: 0/10 Nature of pain: achyness with sharp burst if she tries to pick up something heavy, into her fingers.  *Pt. Had soreness with pressure on the coccyx following annual treatment.  Patient Goals: Improve Bladder control and decrease pain with intercourse.   OBJECTIVE  Changes in: Posture/Observations:  L lumbar, R thoracic scoliosis (mild)   Range of Motion/Flexibilty:  Decreased coccyx mobility  Strength/MMT:  LE MMT:  Pelvic floor: (From 11/03/19:) External Exam: Introitus Appears: WNL Skin integrity: Pt. Has small opening in skin at posterior fourchette where postnatal healing was incomplete. Palpation: mild TTP to B STP Cough: paradoxical  Prolapse visible?: no Scar mobility: slightly decreased  Internal Vaginal Exam: Strength (PERF): 3+/5 Symmetry: L>R for spasm/pain Palpation: TTP to all major regions B with greatest at L anterior PR/PC and at coccygeus. Prolapse: with valsalva, anterior wall visible ~ 1 cm above introitus  Today: spasms present surrounding the coccyx, scar tissue near ~5 o clock closer to coccyx, decreased coccyx mobility.   Abdominal:   Palpation:  Gait Analysis:  INTERVENTIONS THIS SESSION: Manual: Performed TP release to muscles surrounding the coccyx, performed scar release to scar tissue near ~5 o clock closer to coccyx, followed with grade 3-4 A/P mobs to coccyx to decrease spasm and pain and allow for improved balance of  musculature for improved function and decreased symptoms and to improve mobility of joint and surrounding connective tissue and decrease pressure on nerve roots for improved conductivity and function of down-stream tissues.    Total time: 60 min.                              PT Short Term Goals - 12/01/19 1737      PT  SHORT TERM GOAL #1   Title  Patient will demonstrate a coordinated contraction, relaxation, and bulge of the pelvic floor muscles to demonstrate functional recruitment and motion and allow for further strengthening.    Baseline  Pt history suggests poor PFM coordination, breathing dysfunction noted    Time  5    Period  Weeks    Status  Achieved    Target Date  11/09/19      PT SHORT TERM GOAL #2   Title  Patient will demonstrate improved pelvic alignment and balance of musculature surrounding the pelvis to facilitate decreased PFM spasms and decrease pelvic pain.    Baseline  L up-slip, R anterior rotation, spasms surrounding pelvis, LLD, L scoliosis    Time  5    Period  Weeks    Status  Achieved    Target Date  11/09/19      PT SHORT TERM GOAL #3   Title  Patient will demonstrate HEP x1 in the clinic to demonstrate understanding and proper form to allow for further improvement.    Baseline  Pt. lacks knowledge of therapeutic exercises to decrease pain, spasm, and UI    Time  5    Period  Weeks    Status  Achieved    Target Date  11/09/19      PT SHORT TERM GOAL #4   Title  Pt. will be compliant with wearing heel-lift during ~90% of the day while being active to help prevent return of spasms and malalignment.    Baseline  Pt. not using heel-lift, going to chiropractor monthly for adjustment.    Time  5    Period  Weeks    Status  Achieved    Target Date  11/09/19        PT Long Term Goals - 12/01/19 1744      PT LONG TERM GOAL #1   Title  Patient will report no episodes of SUI over the course of the prior two weeks to demonstrate improved functional ability.    Baseline  Pt. having leakage with cough, jump, etc. but full emptying with vomiting (GERD causes her to vomit ~ every month)    Time  10    Period  Weeks    Status  Achieved    Target Date  12/14/19      PT LONG TERM GOAL #2   Title  Patient will report no pain with intercourse to demonstrate improved functional  ability.    Baseline  Pt. having pain with deep thrusting    Time  10    Period  Weeks    Status  On-going    Target Date  12/14/19      PT LONG TERM GOAL #3   Title  Patient will describe pain no greater than 2/10 following 30+ min. of moderate level exercise to demonstrate improved functional ability.    Baseline  Pt. has stopped exercising regularly due to increased pain and SUI. Max pain over last week 5/10  Time  10    Period  Weeks    Status  Achieved    Target Date  12/14/19      PT LONG TERM GOAL #4   Title  Patient will score less than or equal to 30% on the Female NIH-CPSI and 10% on the VQ to demonstrate a reduction in pain, urinary symptoms, and an improved quality of life.    Baseline  VQ; 7/33 (21%), Female NIH-CPSI: 27/43 (63%) As of 2/10: Female NIH-CPSI: 8/43 (18%) and VQ: 4/33 (12%)    Time  10    Period  Weeks    Status  On-going    Target Date  12/14/19            Plan - 12/08/19 1300    Clinical Impression Statement  Pt. Responded well to all interventions today, demonstrating improved coccyx mobility, decreased spasms and TTP, as well as understanding and correct performance of all education and exercises provided today. They will continue to benefit from skilled physical therapy to work toward remaining goals and maximize function as well as decrease likelihood of symptom increase or recurrence.     PT Next Visit Plan  ask about intercourse, give written urge-suppression infom re-check PFM? D/C if appropriate    PT Home Exercise Plan  diaphragmatic breathing, side-stretch, heel lift on R, R chair side-plank, bow-and-arrow, piriformis stretch in seated, tea-pot    Consulted and Agree with Plan of Care  Patient       Patient will benefit from skilled therapeutic intervention in order to improve the following deficits and impairments:     Visit Diagnosis: Other idiopathic scoliosis, thoracolumbar region  Other muscle spasm  Abnormal  posture     Problem List There are no problems to display for this patient.  Cleophus Molt DPT, ATC Cleophus Molt 12/08/2019, 1:01 PM  [place] [facility] MAIN [facility] SERVICES [address] [place], [place], [address] Phone: [phone]   Fax:  [phone]  Name: Alice Turner MRN: [id number] Date of Birth: September 17, 1971

## 2019-12-15 ENCOUNTER — Ambulatory Visit: Payer: BC Managed Care – PPO

## 2019-12-15 ENCOUNTER — Other Ambulatory Visit: Payer: Self-pay

## 2019-12-15 DIAGNOSIS — M4125 Other idiopathic scoliosis, thoracolumbar region: Secondary | ICD-10-CM | POA: Diagnosis not present

## 2019-12-15 DIAGNOSIS — R293 Abnormal posture: Secondary | ICD-10-CM

## 2019-12-15 DIAGNOSIS — M62838 Other muscle spasm: Secondary | ICD-10-CM

## 2019-12-15 NOTE — Patient Instructions (Signed)
Self Internal Trigger Point Relief    1) Wash your hands and prop yourself up in a way where you can easily reach the vagina. You may wish to have a small hand-held mirror near by.  2) lubricate the tool and vaginal opening using a hypoallergenic lubricant such as "slippery-stuff".   3) Slowly and gently insert the tool into the vagina using deep breaths to allow relaxation of the muscles around the tool.  4) Avoiding the "12 o-clock" region near the urethra, gently use the handle of the tool like a lever to press the angled tip of the tool onto the wall of the pelvic floor.   5) Move the tool to different areas of the pelvic floor and feel for areas that are tender called "trigger points". When you find one hold the tool still, applying just enough pressure to elicit mild discomfort and take deep belly breaths until the discomfort subsides or decreases by at least 50%.   6) Repeat the process for any trigger points you find spending between 3-10 minutes on this per night until you do not find any more trigger points or you are told otherwise by your therapist..  Intimate Rose internal trigger point release tool [phone]  Dr. Alonna Minium Premium Prostate Massager   [phone]

## 2019-12-15 NOTE — Therapy (Signed)
[place] MAIN [facility] SERVICES [address] [place], [place], [address] Phone: [phone]   Fax:  [phone]  Physical Therapy Treatment  The patient has been informed of current processes in place at Outpatient Rehab to protect patients from Covid-19 exposure including social distancing, schedule modifications, and new cleaning procedures. After discussing their particular risk with a therapist based on the patient's personal risk factors, the patient has decided to proceed with in-person therapy.   Patient Details  Name: Alice Turner MRN: [id number] Date of Birth: 10/26/1970 Referring Provider (PT): Benjaman Kindler   Encounter Date: 12/15/2019  PT End of Session - 12/16/19 0830    Visit Number  11    Number of Visits  16    Date for PT Re-Evaluation  12/14/19    Authorization Type  BCBS/UHC    Authorization Time Period  12/05/2018 through 12/14/2019    Authorization - Visit Number  1    Authorization - Number of Visits  6    Progress Note Due on Visit  20    PT Start Time  1443    PT Stop Time  1540    PT Time Calculation (min)  60 min    Activity Tolerance  Patient tolerated treatment well;No increased pain    Behavior During Therapy  WFL for tasks assessed/performed       Past Medical History:  Diagnosis Date  . Anemia    H/O  . GERD (gastroesophageal reflux disease)     Past Surgical History:  Procedure Laterality Date  . COLONOSCOPY    . CYST EXCISION     vocal cord  . SEPTOPLASTY N/A 09/17/2016   Procedure: REPAIR OF SEPTAL PERFORATION;  Surgeon: Clyde Canterbury, MD;  Location: [place];  Service: ENT;  Laterality: N/A;    There were no vitals filed for this visit.  Pelvic Floor Physical Therapy Treatment Note  SCREENING  Changes in medications, allergies, or medical history?: none    SUBJECTIVE  Patient reports: No complaints other than pain if her partner is on top with intercourse ~ 90% of the  time. Is able to hold bladder ~ 10-63mn. And wait for second urge. Went ~ 3 hrs. One time and had to cough after holding for a few min,. Was worried that she was going to leak but was able to make it to the bathroom. Not feeling the urge to urinate with abdominal exercise anymore.   Precautions:  none  Pain update:  Location of pain: R elbow Current pain: 0/10  Max pain: 5/10 Least pain: 0/10 Nature of pain: achyness with sharp burst if she tries to pick up something heavy, into her fingers.  *Pt. Had soreness with pressure on the coccyx following internal treatment.  Patient Goals: Improve Bladder control and decrease pain with intercourse.   OBJECTIVE  Changes in: Posture/Observations:  L lumbar, R thoracic scoliosis (mild)   Range of Motion/Flexibilty:   Strength/MMT:  LE MMT:  Pelvic floor: (From 11/03/19:) External Exam: Introitus Appears: WNL Skin integrity: Pt. Has small opening in skin at posterior fourchette where postnatal healing was incomplete. Palpation: mild TTP to B STP Cough: paradoxical  Prolapse visible?: no Scar mobility: slightly decreased  Internal Vaginal Exam: Strength (PERF): 3+/5 Symmetry: L>R for spasm/pain Palpation: TTP to all major regions B with greatest at L anterior PR/PC and at coccygeus. Prolapse: with valsalva, anterior wall visible ~ 1 cm above introitus  Today: TTP through all major areas palpated today  with the greatest tenderness at anterior PR/PC and OI.   Abdominal:   Palpation:  Gait Analysis:  INTERVENTIONS THIS SESSION: Manual: Performed TP release to all internal PFM on R and to anterior PR/PC and OI on L to decrease spasm and pain and allow for improved balance of musculature for improved function and decreased symptoms.  Self-care: Educated on how to perform self TP release internally with a tool and where to purchase the tool as well as how a cock ring may help her husband be able to maintain erection in the  position that is most comfortable fore her and how oh-nuts can be used to manage depth of penetration to help improve comfort with intercourse if she is unable to maintain decreased spasms. Encouraged to discuss potential use of a hormonal cream such as premarin with her MD if necessary to improve tissue health and lubrication.   Total time: 60 min.                              PT Short Term Goals - 12/01/19 1737      PT SHORT TERM GOAL #1   Title  Patient will demonstrate a coordinated contraction, relaxation, and bulge of the pelvic floor muscles to demonstrate functional recruitment and motion and allow for further strengthening.    Baseline  Pt history suggests poor PFM coordination, breathing dysfunction noted    Time  5    Period  Weeks    Status  Achieved    Target Date  11/09/19      PT SHORT TERM GOAL #2   Title  Patient will demonstrate improved pelvic alignment and balance of musculature surrounding the pelvis to facilitate decreased PFM spasms and decrease pelvic pain.    Baseline  L up-slip, R anterior rotation, spasms surrounding pelvis, LLD, L scoliosis    Time  5    Period  Weeks    Status  Achieved    Target Date  11/09/19      PT SHORT TERM GOAL #3   Title  Patient will demonstrate HEP x1 in the clinic to demonstrate understanding and proper form to allow for further improvement.    Baseline  Pt. lacks knowledge of therapeutic exercises to decrease pain, spasm, and UI    Time  5    Period  Weeks    Status  Achieved    Target Date  11/09/19      PT SHORT TERM GOAL #4   Title  Pt. will be compliant with wearing heel-lift during ~90% of the day while being active to help prevent return of spasms and malalignment.    Baseline  Pt. not using heel-lift, going to chiropractor monthly for adjustment.    Time  5    Period  Weeks    Status  Achieved    Target Date  11/09/19        PT Long Term Goals - 12/01/19 1744      PT LONG TERM GOAL  #1   Title  Patient will report no episodes of SUI over the course of the prior two weeks to demonstrate improved functional ability.    Baseline  Pt. having leakage with cough, jump, etc. but full emptying with vomiting (GERD causes her to vomit ~ every month)    Time  10    Period  Weeks    Status  Achieved    Target Date  12/14/19  PT LONG TERM GOAL #2   Title  Patient will report no pain with intercourse to demonstrate improved functional ability.    Baseline  Pt. having pain with deep thrusting    Time  10    Period  Weeks    Status  On-going    Target Date  12/14/19      PT LONG TERM GOAL #3   Title  Patient will describe pain no greater than 2/10 following 30+ min. of moderate level exercise to demonstrate improved functional ability.    Baseline  Pt. has stopped exercising regularly due to increased pain and SUI. Max pain over last week 5/10    Time  10    Period  Weeks    Status  Achieved    Target Date  12/14/19      PT LONG TERM GOAL #4   Title  Patient will score less than or equal to 30% on the Female NIH-CPSI and 10% on the VQ to demonstrate a reduction in pain, urinary symptoms, and an improved quality of life.    Baseline  VQ; 7/33 (21%), Female NIH-CPSI: 27/43 (63%) As of 2/10: Female NIH-CPSI: 8/43 (18%) and VQ: 4/33 (12%)    Time  10    Period  Weeks    Status  On-going    Target Date  12/14/19            Plan - 12/16/19 0831    Clinical Impression Statement  Pt. is responding well to PT and has met all goals with the exception of continuing to have pain with intercourse. She responded well to all treatment today, demonstrating decreased PFM spasms and understanding of all education on how to perform self internal TP release to continue to decrease and manage spasms but would benefit from 6 more visit to allow [place] to continue working toward resolution of dyspareunia and to do all that can be done to help prevent the return of spasms/pain and make sure Pt.  is confident in how to perform self internal TP release to manage spasms if they do return or has management techniques in place to prevent pain with intercourse.    Personal Factors and Comorbidities  Comorbidity 3+    Comorbidities  GERD, LLD, Scoliosis    Examination-Activity Limitations  Continence    Examination-Participation Restrictions  Interpersonal Relationship;Other    Stability/Clinical Decision Making  Evolving/Moderate complexity    Clinical Decision Making  Moderate    Rehab Potential  Good    PT Frequency  1x / week    PT Duration  6 weeks    PT Treatment/Interventions  ADLs/Self Care Home Management;Biofeedback;Moist Heat;Electrical Stimulation;Traction;Ultrasound;Therapeutic activities;Functional mobility training;Gait training;Therapeutic exercise;Neuromuscular re-education;Patient/family education;Manual techniques;Scar mobilization;Taping;Dry needling;Passive range of motion;Joint Manipulations;Spinal Manipulations    PT Next Visit Plan  ask about intercourse, give written urge-suppression infom re-check adductors and PFM, re-assess L1-L5 for cause of spasms    PT Home Exercise Plan  diaphragmatic breathing, side-stretch, heel lift on R, R chair side-plank, bow-and-arrow, piriformis stretch in seated, tea-pot, self internal TP release, oh-nuts, cock-ring for partner to maintain erection.    Consulted and Agree with Plan of Care  Patient       Patient will benefit from skilled therapeutic intervention in order to improve the following deficits and impairments:  Decreased mobility, Increased muscle spasms, Decreased scar mobility, Improper body mechanics, Decreased activity tolerance, Decreased coordination, Decreased strength, Increased fascial restricitons, Pain, Postural dysfunction, Impaired flexibility  Visit Diagnosis: Other idiopathic scoliosis, thoracolumbar  region  Other muscle spasm  Abnormal posture     Problem List There are no problems to display for this  patient.  Willa Rough DPT, ATC Willa Rough 12/16/2019, 8:44 AM  [place] MAIN [facility] SERVICES [address] [place], [place], [address] Phone: [phone]   Fax:  [phone]  Name: Alice Turner MRN: [id number] Date of Birth: 23-Dec-1970

## 2019-12-22 ENCOUNTER — Ambulatory Visit: Payer: BC Managed Care – PPO | Attending: Obstetrics and Gynecology

## 2019-12-22 ENCOUNTER — Other Ambulatory Visit: Payer: Self-pay

## 2019-12-22 DIAGNOSIS — M4125 Other idiopathic scoliosis, thoracolumbar region: Secondary | ICD-10-CM

## 2019-12-22 DIAGNOSIS — M62838 Other muscle spasm: Secondary | ICD-10-CM | POA: Diagnosis present

## 2019-12-22 DIAGNOSIS — R293 Abnormal posture: Secondary | ICD-10-CM | POA: Insufficient documentation

## 2019-12-22 NOTE — Patient Instructions (Signed)
Pelvic Tilt With Pelvic Floor (Hook-Lying)        Lie with hips and knees bent. Pull pubic bone to your rib cage and flatten low back while breathing out so that pelvis tilts. Repeat _2x15__ times. Do _1-2__ times a day.    Bring feet together and let the knees fall out to the sides. Hold for 5 deep breaths, rest then repeat 2 more times.

## 2019-12-22 NOTE — Therapy (Signed)
[place] MAIN [facility] SERVICES [address] [place], [place], [address] Phone: [phone]   Fax:  [phone]  Physical Therapy Treatment  The patient has been informed of current processes in place at [facility]patient [facility]hab to protect patients from Covid-19 exposure including social distancing, schedule modifications, and new cleaning procedures. After discussing their particular risk with a therapist based on the patient's personal risk factors, the patient has decided to proceed with in-person therapy.   Patient Details  Name: Alice Turner MRN: [id number] Date of Birth: 1970-11-27 Referring Provider (PT): Christeen Douglas   Encounter Date: 12/22/2019  PT End of Session - 12/23/19 1551    Visit Number  12    Number of Visits  16    Date for PT Re-Evaluation  01/27/20    Authorization Type  BCBS/U[employer]    Authorization Time Period  12/15/2018 through 01/27/2020    Authorization - Visit Number  2    Authorization - Number of Visits  6    Progress Note Due on Visit  20    PT Start Time  0730    PT Stop Time  0830    PT Time Calculation (min)  60 min    Activity Tolerance  Patient tolerated treatment well;No increased pain    Behavior During Therapy  WFL for tasks assessed/performed       Past Medical History:  Diagnosis Date  . Anemia    H/O  . GERD (gastroesophageal reflux disease)     Past Surgical History:  Procedure Laterality Date  . COLONOSCOPY    . CYST EXCISION     vocal cord  . SEPTOPLASTY N/A 09/17/2016   Procedure: REPAIR OF SEPTAL PERFORATION;  Surgeon: Geanie Logan, MD;  Location: [facility];  Service: ENT;  Laterality: N/A;    There were no vitals filed for this visit.   Pelvic Floor Physical Therapy Treatment Note  SCREENING  Changes in medications, allergies, or medical history?: none    SUBJECTIVE  Patient reports: Did get the tool, picked up the tool on Friday and used the cock ring which helped.  Tried using the tool but did not feel like she was getting to the place where she was getting to the right spot and now she is on her period. Intercourse worked out better with the cock ring but was not changed in problematic positions. Soreness around coccyx eased off by the day after treatment. Has been having some LBP again.  Precautions:  none  Pain update:  Location of pain: R low back Current pain: 4/10  Max pain: 6/10 Least pain: 0/10 Nature of pain: "sharp catch"  *no pain following treatment.  Patient Goals: Improve Bladder control and decrease pain with intercourse.   OBJECTIVE  Changes in: Posture/Observations:   Range of Motion/Flexibilty:  Decreased mobility/tenderness at L 3-4  Strength/MMT:  LE MMT:  Pelvic floor: (From 11/03/19:) External Exam: Introitus Appears: WNL Skin integrity: Pt. Has small opening in skin at posterior fourchette where postnatal healing was incomplete. Palpation: mild TTP to B STP Cough: paradoxical  Prolapse visible?: no Scar mobility: slightly decreased  Internal Vaginal Exam: Strength (PERF): 3+/5 Symmetry: L>R for spasm/pain Palpation: TTP to all major regions B with greatest at L anterior PR/PC and at coccygeus. Prolapse: with valsalva, anterior wall visible ~ 1 cm above introitus  Today: TTP through all major areas palpated today with the greatest tenderness at anterior PR/PC and OI.   Abdominal:  Pt. Has difficulty  recruiting/isolating the deep core. Able to recruit following session with VC, TC.  Palpation: TTP to B gracilis, R lumbar multifdus at L4-5 and paraspinals at L2-4  Gait Analysis:  INTERVENTIONS THIS SESSION: Manual: Performed TP release to B gracilis, R lumbar multifdus at L4-5 and paraspinals at L2-4 followed by grade 3-4 PA mobs to L 3-4 to decrease spasm and pain and allow for improved balance of musculature for improved function and decreased symptoms and to improve mobility of joint and  surrounding connective tissue and decrease pressure on nerve roots for improved conductivity and function of down-stream tissues.   Therex: Educated on and practiced posterior pelvic tilt with TA recruitment to stabilze the L/S junction and decrease multifidus spasms via reciprocal inhibition. Educated on and practiced butterfly stretch to maintain adductor length and help prevent return of PFM spasms.    Total time: 60 min.                             PT Short Term Goals - 12/16/19 0844      PT SHORT TERM GOAL #1   Title  Patient will demonstrate a coordinated contraction, relaxation, and bulge of the pelvic floor muscles to demonstrate functional recruitment and motion and allow for further strengthening.    Baseline  Pt history suggests poor PFM coordination, breathing dysfunction noted    Time  5    Period  Weeks    Status  Achieved    Target Date  11/09/19      PT SHORT TERM GOAL #2   Title  Patient will demonstrate improved pelvic alignment and balance of musculature surrounding the pelvis to facilitate decreased PFM spasms and decrease pelvic pain.    Baseline  L up-slip, R anterior rotation, spasms surrounding pelvis, LLD, L scoliosis    Time  5    Period  Weeks    Status  Achieved    Target Date  11/09/19      PT SHORT TERM GOAL #3   Title  Patient will demonstrate HEP x1 in the clinic to demonstrate understanding and proper form to allow for further improvement.    Baseline  Pt. lacks knowledge of therapeutic exercises to decrease pain, spasm, and UI    Time  5    Period  Weeks    Status  Achieved    Target Date  11/09/19      PT SHORT TERM GOAL #4   Title  Pt. will be compliant with wearing heel-lift during ~90% of the day while being active to help prevent return of spasms and malalignment.    Baseline  Pt. not using heel-lift, going to chiropractor monthly for adjustment.    Time  5    Period  Weeks    Status  Achieved    Target Date   11/09/19        PT Long Term Goals - 12/16/19 0844      PT LONG TERM GOAL #1   Title  Patient will report no episodes of SUI over the course of the prior two weeks to demonstrate improved functional ability.    Baseline  Pt. having leakage with cough, jump, etc. but full emptying with vomiting (GERD causes her to vomit ~ every month)    Time  10    Period  Weeks    Status  Achieved    Target Date  12/14/19      PT LONG TERM  GOAL #2   Title  Patient will report no pain with intercourse to demonstrate improved functional ability.    Baseline  Pt. having pain with deep thrusting    Time  16    Period  Weeks    Status  On-going    Target Date  01/27/20      PT LONG TERM GOAL #3   Title  Patient will describe pain no greater than 2/10 following 30+ min. of moderate level exercise to demonstrate improved functional ability.    Baseline  Pt. has stopped exercising regularly due to increased pain and SUI. Max pain over last week 5/10    Time  10    Period  Weeks    Status  Achieved    Target Date  12/14/19      PT LONG TERM GOAL #4   Title  Patient will score less than or equal to 30% on the Female NIH-CPSI and 10% on the VQ to demonstrate a reduction in pain, urinary symptoms, and an improved quality of life.    Baseline  VQ; 7/33 (21%), Female NIH-CPSI: 27/43 (63%) As of 2/10: Female NIH-CPSI: 8/43 (18%) and VQ: 4/33 (12%)    Time  16    Period  Weeks    Status  On-going    Target Date  01/27/20            Plan - 12/23/19 1553    Clinical Impression Statement  Pt. Responded well to all interventions today, demonstrating improved deep-core recruitment and decreased spasm and pain. as well as understanding and correct performance of all education and exercises provided today. They will continue to benefit from skilled physical therapy to work toward remaining goals and maximize function as well as decrease likelihood of symptom increase or recurrence.     PT Next Visit Plan   re-check adductors and PFM, review deep-core and progress    PT Home Exercise Plan  diaphragmatic breathing, side-stretch, heel lift on R, R chair side-plank, bow-and-arrow, piriformis stretch in seated, tea-pot, self internal TP release, oh-nuts, cock-ring for partner to maintain erection, posterior pelvic tilts.    Consulted and Agree with Plan of Care  Patient       Patient will benefit from skilled therapeutic intervention in order to improve the following deficits and impairments:     Visit Diagnosis: Other idiopathic scoliosis, thoracolumbar region  Other muscle spasm  Abnormal posture     Problem List There are no problems to display for this patient.  Cleophus Molt DPT, ATC Cleophus Molt 12/23/2019, 3:55 PM  [facility] MAIN [facility] SERVICES [address] [place], [place], [address] Phone: [phone]   Fax:  [phone]  Name: WILMARIE SPARLIN MRN: [id number] Date of Birth: [birth year]

## 2019-12-29 ENCOUNTER — Other Ambulatory Visit: Payer: Self-pay

## 2019-12-29 ENCOUNTER — Ambulatory Visit: Payer: BC Managed Care – PPO

## 2019-12-29 DIAGNOSIS — M62838 Other muscle spasm: Secondary | ICD-10-CM

## 2019-12-29 DIAGNOSIS — R293 Abnormal posture: Secondary | ICD-10-CM

## 2019-12-29 DIAGNOSIS — M4125 Other idiopathic scoliosis, thoracolumbar region: Secondary | ICD-10-CM

## 2019-12-29 NOTE — Therapy (Signed)
[place] [facility] MAIN [facility] SERVICES [address] [place], [place], [address] Phone: [phone]   Fax:  [phone]  Physical Therapy Treatment  The patient has been informed of current processes in place at Outpatient Rehab to protect patients from Covid-19 exposure including social distancing, schedule modifications, and new cleaning procedures. After discussing their particular risk with a therapist based on the patient's personal risk factors, the patient has decided to proceed with in-person therapy.   Patient Details  Name: Alice Turner MRN: [id number] Date of Birth: June 05, 1971 Referring Provider (PT): Christeen Douglas   Encounter Date: 12/29/2019  PT End of Session - 12/29/19 1059    Visit Number  13    Number of Visits  16    Date for PT Re-Evaluation  01/27/20    Authorization Type  BCBS/UHC    Authorization Time Period  12/15/2018 through 01/27/2020    Authorization - Visit Number  3    Authorization - Number of Visits  6    Progress Note Due on Visit  20    PT Start Time  0730    PT Stop Time  0830    PT Time Calculation (min)  60 min    Activity Tolerance  Patient tolerated treatment well;No increased pain    Behavior During Therapy  WFL for tasks assessed/performed       Past Medical History:  Diagnosis Date  . Anemia    H/O  . GERD (gastroesophageal reflux disease)     Past Surgical History:  Procedure Laterality Date  . COLONOSCOPY    . CYST EXCISION     vocal cord  . SEPTOPLASTY N/A 09/17/2016   Procedure: REPAIR OF SEPTAL PERFORATION;  Surgeon: Geanie Logan, MD;  Location: [facility];  Service: ENT;  Laterality: N/A;    There were no vitals filed for this visit.   Pelvic Floor Physical Therapy Treatment Note  SCREENING  Changes in medications, allergies, or medical history?: none    SUBJECTIVE  Patient reports: She has not had any pain with intercourse and no pain until today but it is just a  little achy in her low back but thinks this is probably since she got her vaccine yesterday.   Precautions:  none  Pain update:  Location of pain: R low back Current pain: 2/10  Max pain: 2/10 Least pain: 0/10 Nature of pain: "achy"  *no pain following treatment.  Patient Goals: Improve Bladder control and decrease pain with intercourse.   OBJECTIVE  Changes in: Posture/Observations:   Range of Motion/Flexibilty:  Decreased mobility/tenderness at L 3-4  Strength/MMT:  LE MMT:  Pelvic floor: Internal Vaginal Exam: Strength (PERF): 4+/5 Symmetry: symmetrical Palpation: TTP to all major regions B Prolapse: with valsalva, anterior wall visible ~ 1 cm above introitus   Abdominal:  Pt. Has difficulty recruiting/isolating the deep core. Able to recruit following session with VC, TC.  Palpation: TTP to B gracilis, R lumbar multifdus at L4-5 and paraspinals at L2-4  Gait Analysis:  INTERVENTIONS THIS SESSION: Manual: Re-assessed PFM and performed TP release to B Adductor brevis to decrease spasm and pain and allow for improved balance of musculature for improved function and decreased symptoms.  Therex: reviewed posterior pelvic tilts with TC, VC for greater TA recruitment. Pt. Not ready to attempt Mini-marches upon attempt.  Dry-needle: Performed TPDN with a .30x137mm needle and standard approach as described below to decrease spasm and pain and allow for improved balance of musculature for improved  function and decreased symptoms.  Self-care: educated Pt. On and practiced using the internal TP release tool to allow for improved efficacy of HEP performance, decreased spasm, and no pain with intercourse.  Total time: 60 min.                            PT Short Term Goals - 12/16/19 0844      PT SHORT TERM GOAL #1   Title  Patient will demonstrate a coordinated contraction, relaxation, and bulge of the pelvic floor muscles to demonstrate  functional recruitment and motion and allow for further strengthening.    Baseline  Pt history suggests poor PFM coordination, breathing dysfunction noted    Time  5    Period  Weeks    Status  Achieved    Target Date  11/09/19      PT SHORT TERM GOAL #2   Title  Patient will demonstrate improved pelvic alignment and balance of musculature surrounding the pelvis to facilitate decreased PFM spasms and decrease pelvic pain.    Baseline  L up-slip, R anterior rotation, spasms surrounding pelvis, LLD, L scoliosis    Time  5    Period  Weeks    Status  Achieved    Target Date  11/09/19      PT SHORT TERM GOAL #3   Title  Patient will demonstrate HEP x1 in the clinic to demonstrate understanding and proper form to allow for further improvement.    Baseline  Pt. lacks knowledge of therapeutic exercises to decrease pain, spasm, and UI    Time  5    Period  Weeks    Status  Achieved    Target Date  11/09/19      PT SHORT TERM GOAL #4   Title  Pt. will be compliant with wearing heel-lift during ~90% of the day while being active to help prevent return of spasms and malalignment.    Baseline  Pt. not using heel-lift, going to chiropractor monthly for adjustment.    Time  5    Period  Weeks    Status  Achieved    Target Date  11/09/19        PT Long Term Goals - 12/29/19 0811      PT LONG TERM GOAL #1   Title  Patient will report no episodes of SUI over the course of the prior two weeks to demonstrate improved functional ability.    Baseline  Pt. having leakage with cough, jump, etc. but full emptying with vomiting (GERD causes her to vomit ~ every month)    Time  10    Period  Weeks    Status  Achieved    Target Date  12/14/19      PT LONG TERM GOAL #2   Title  Patient will report no pain with intercourse to demonstrate improved functional ability.    Baseline  Pt. having pain with deep thrusting    Time  16    Period  Weeks    Status  Achieved    Target Date  01/27/20       PT LONG TERM GOAL #3   Title  Patient will describe pain no greater than 2/10 following 30+ min. of moderate level exercise to demonstrate improved functional ability.    Baseline  Pt. has stopped exercising regularly due to increased pain and SUI. Max pain over last week 5/10    Time  10    Period  Weeks    Status  Achieved    Target Date  12/14/19      PT LONG TERM GOAL #4   Title  Patient will score less than or equal to 30% on the Female NIH-CPSI and 10% on the VQ to demonstrate a reduction in pain, urinary symptoms, and an improved quality of life.    Baseline  VQ; 7/33 (21%), Female NIH-CPSI: 27/43 (63%) As of 2/10: Female NIH-CPSI: 8/43 (18%) and VQ: 4/33 (12%)    Time  16    Period  Weeks    Status  On-going    Target Date  01/27/20            Plan - 12/29/19 1100    Clinical Impression Statement  Pt. Responded well to all interventions today, demonstrating between-session improvement in pain with intercourse, understanding of how to perform self TP release, decreased adductor spasm and TTP, as well as understanding and correct performance of all education and exercises provided today. They will continue to benefit from skilled physical therapy to work toward remaining goals and maximize function as well as decrease likelihood of symptom increase or recurrence.     PT Next Visit Plan  re-check adductors and PFM, review deep-core and progress, give outcome measures, D/C if appropriate.    PT Home Exercise Plan  diaphragmatic breathing, side-stretch, heel lift on R, R chair side-plank, bow-and-arrow, piriformis stretch in seated, tea-pot, self internal TP release, oh-nuts, cock-ring for partner to maintain erection, posterior pelvic tilts.    Consulted and Agree with Plan of Care  Patient       Patient will benefit from skilled therapeutic intervention in order to improve the following deficits and impairments:     Visit Diagnosis: Other idiopathic scoliosis, thoracolumbar  region  Other muscle spasm  Abnormal posture     Problem List There are no problems to display for this patient.  Cleophus Molt DPT, ATC Cleophus Molt 12/29/2019, 1:31 PM  [place] [facility] MAIN [facility] SERVICES [address] [place], [place], [address] Phone: [phone]   Fax:  [phone]  Name: Alice Turner MRN: [id number] Date of Birth: [birth year]

## 2020-01-05 ENCOUNTER — Ambulatory Visit: Payer: BC Managed Care – PPO

## 2020-01-05 ENCOUNTER — Other Ambulatory Visit: Payer: Self-pay

## 2020-01-05 DIAGNOSIS — M62838 Other muscle spasm: Secondary | ICD-10-CM

## 2020-01-05 DIAGNOSIS — R293 Abnormal posture: Secondary | ICD-10-CM

## 2020-01-05 DIAGNOSIS — M4125 Other idiopathic scoliosis, thoracolumbar region: Secondary | ICD-10-CM | POA: Diagnosis not present

## 2020-01-05 NOTE — Therapy (Signed)
[facility] MAIN [facility] SERVICES [address] [place], [place], [address] Phone: [phone]   Fax:  [phone]  Physical Therapy Treatment and Discharge Summary  The patient has been informed of current processes in place at Outpatient Rehab to protect patients from Covid-19 exposure including social distancing, schedule modifications, and new cleaning procedures. After discussing their particular risk with a therapist based on the patient's personal risk factors, the patient has decided to proceed with in-person therapy.   Patient Details  Name: Alice Turner MRN: [id number] Date of Birth: 1971/06/14 Referring Provider (PT): Benjaman Kindler   Encounter Date: 01/05/2020  PT End of Session - 01/07/20 1034    Visit Number  14    Number of Visits  16    Date for PT Re-Evaluation  01/27/20    Authorization Type  BC[employer]    Authorization Time Period  12/15/2018 through 01/27/2020    Authorization - Visit Number  4    Authorization - Number of Visits  6    Progress Note Due on Visit  20    PT Start Time  0932    PT Stop Time  3557    PT Time Calculation (min)  60 min    Activity Tolerance  Patient tolerated treatment well;No increased pain    Behavior During Therapy  WFL for tasks assessed/performed       Past Medical History:  Diagnosis Date  . Anemia    H/O  . GERD (gastroesophageal reflux disease)     Past Surgical History:  Procedure Laterality Date  . COLONOSCOPY    . CYST EXCISION     vocal cord  . SEPTOPLASTY N/A 09/17/2016   Procedure: REPAIR OF SEPTAL PERFORATION;  Surgeon: Clyde Canterbury, MD;  Location: [facility];  Service: ENT;  Laterality: N/A;    There were no vitals filed for this visit.    Pelvic Floor Physical Therapy Treatment Note and Discharge Summary  SCREENING  Changes in medications, allergies, or medical history?: none    SUBJECTIVE  Patient reports: No complaints, intercourse has been fine.  Has a little LBP on the R but believes it is upset from yard work this weekend. It is fleeting.  Precautions:  none  Pain update:  Location of pain: R low back Current pain: 2/10  Max pain: 3/22 (catches with certain movements) Least pain: 0/10 Nature of pain: "achy"  *no pain following treatment.  Patient Goals: Improve Bladder control and decrease pain with intercourse.   OBJECTIVE  Changes in: Posture/Observations:   Range of Motion/Flexibilty:  Decreased mobility/tenderness at L 3-4  Strength/MMT:  LE MMT:  Pelvic floor: Internal Vaginal Exam: Strength (PERF): 4+/5 Symmetry: symmetrical Palpation: TTP to all major regions B Prolapse: with valsalva, anterior wall visible ~ 1 cm above introitus (from previous session)  **Pt. Reports she has little to no TTP with use of self-release tool and no pain with intercourse, does not wish to re-assess today.   Abdominal:  Able to differentiate TA from rectus abdominus recruitment. Used cue to "zip up the pants" to get consistent deep-core recruitment without other TC or VC needed.  Palpation: TTP to R pectineus, L lumbar multifdus and paraspinals at L4-5  Gait Analysis:  INTERVENTIONS THIS SESSION: Manual: Performed TP release to R pectineus, L lumbar multifdus and paraspinals at L4-5 to decrease spasm and pain and allow for improved balance of musculature for improved function and decreased symptoms.  Therex: reviewed posterior pelvic tilts emphasis on  isolating TA rather than Rectus Abdominus, found verbal cue that allowed for consistent correct performance to improve efficacy of HEP.  Self-care: reviewed goals and filled out outcome measures, discussed POC to D/C with the potential for future TPDN if needed to help maintain scoliosis.  Total time: 60 min.                           PT Short Term Goals - 01/07/20 1037      PT SHORT TERM GOAL #1   Title  Patient will demonstrate a  coordinated contraction, relaxation, and bulge of the pelvic floor muscles to demonstrate functional recruitment and motion and allow for further strengthening.    Baseline  Pt history suggests poor PFM coordination, breathing dysfunction noted    Time  5    Period  Weeks    Status  Achieved    Target Date  11/09/19      PT SHORT TERM GOAL #2   Title  Patient will demonstrate improved pelvic alignment and balance of musculature surrounding the pelvis to facilitate decreased PFM spasms and decrease pelvic pain.    Baseline  L up-slip, R anterior rotation, spasms surrounding pelvis, LLD, L scoliosis    Time  5    Period  Weeks    Status  Achieved    Target Date  11/09/19      PT SHORT TERM GOAL #3   Title  Patient will demonstrate HEP x1 in the clinic to demonstrate understanding and proper form to allow for further improvement.    Baseline  Pt. lacks knowledge of therapeutic exercises to decrease pain, spasm, and UI    Time  5    Period  Weeks    Status  Achieved    Target Date  11/09/19      PT SHORT TERM GOAL #4   Title  Pt. will be compliant with wearing heel-lift during ~90% of the day while being active to help prevent return of spasms and malalignment.    Baseline  Pt. not using heel-lift, going to chiropractor monthly for adjustment.    Time  5    Period  Weeks    Status  Achieved    Target Date  11/09/19        PT Long Term Goals - 01/05/20 1749      PT LONG TERM GOAL #1   Title  Patient will report no episodes of SUI over the course of the prior two weeks to demonstrate improved functional ability.    Baseline  Pt. having leakage with cough, jump, etc. but full emptying with vomiting (GERD causes her to vomit ~ every month)    Time  10    Period  Weeks    Status  Achieved    Target Date  12/14/19      PT LONG TERM GOAL #2   Title  Patient will report no pain with intercourse to demonstrate improved functional ability.    Baseline  Pt. having pain with deep  thrusting    Time  16    Period  Weeks    Status  Achieved    Target Date  01/27/20      PT LONG TERM GOAL #3   Title  Patient will describe pain no greater than 2/10 following 30+ min. of moderate level exercise to demonstrate improved functional ability.    Baseline  Pt. has stopped exercising regularly due to increased pain  and SUI. Max pain over last week 5/10    Time  10    Period  Weeks    Status  Achieved    Target Date  12/14/19      PT LONG TERM GOAL #4   Title  Patient will score less than or equal to 30% on the Female NIH-CPSI and 10% on the VQ to demonstrate a reduction in pain, urinary symptoms, and an improved quality of life.    Baseline  VQ; 7/33 (21%), Female NIH-CPSI: 27/43 (63%) As of 2/10: Female NIH-CPSI: 8/43 (18%) and VQ: 4/33 (12%) As of 3/17: 7% on the Female NIH-CPSI, and 12% on the VQ.    Time  16    Period  Weeks    Status  Partially Met    Target Date  01/27/20            Plan - 01/07/20 1035    Clinical Impression Statement  Pt. has met or nearly met all goals and feel confident that she can continue to improve with use of her HEP at home at this time. She has not needed to go to the chiropractor for an adjustment in months, her pain is only occasional and tolerable and she is able to use HEP to manage. She has not had any incontinence lately or pain with intercourse and she demonstrates ability to use her self-release tool to manage PFM spasms as needed. We will D/C at this time to HEP.    PT Next Visit Plan  D/C    PT Home Exercise Plan  diaphragmatic breathing, side-stretch, heel lift on R, R chair side-plank, bow-and-arrow, piriformis stretch in seated, tea-pot, self internal TP release, oh-nuts, cock-ring for partner to maintain erection, mini-marches.    Consulted and Agree with Plan of Care  Patient       Patient will benefit from skilled therapeutic intervention in order to improve the following deficits and impairments:     Visit  Diagnosis: Other idiopathic scoliosis, thoracolumbar region  Other muscle spasm  Abnormal posture     Problem List There are no problems to display for this patient.  Willa Rough DPT, ATC Willa Rough 01/07/2020, 10:45 AM  [place] MAIN [facility] SERVICES [address] [place], [place], [address] Phone: [phone]   Fax:  [phone]  Name: Alice Turner MRN: [id number] Date of Birth: 03-27-1971

## 2020-01-05 NOTE — Patient Instructions (Signed)
Mini-Marches    Exhale, drawing the lower tummy (TA) in toward the back bone (zip up the pants!) and hold contraction while you lift one foot ~ 2 inches off the mat, then the other foot before relaxing and resetting. Try to keep your hips from rocking, using your hands to sense whether they are staying even as pictured.      Perform _10__ repetitions for _3__ sets. Do this _1-2_ times per day.  Make sure that when you cough, sneeze, laugh that you "zip up the pants" rather than feel the belly go out.  -Practice this by saying "ha-ha" out loud while drawing in the muscle "zipping up the pants" until it becomes habit.

## 2021-03-12 IMAGING — MG DIGITAL SCREENING BILAT W/ TOMO W/ CAD
8 series · 8 of 24 positions shown · non-contrast
Comparison: Previous exam(s).

CLINICAL DATA: Screening.

EXAM:
DIGITAL SCREENING BILATERAL MAMMOGRAM WITH TOMO AND CAD

[L CC synth-2D]
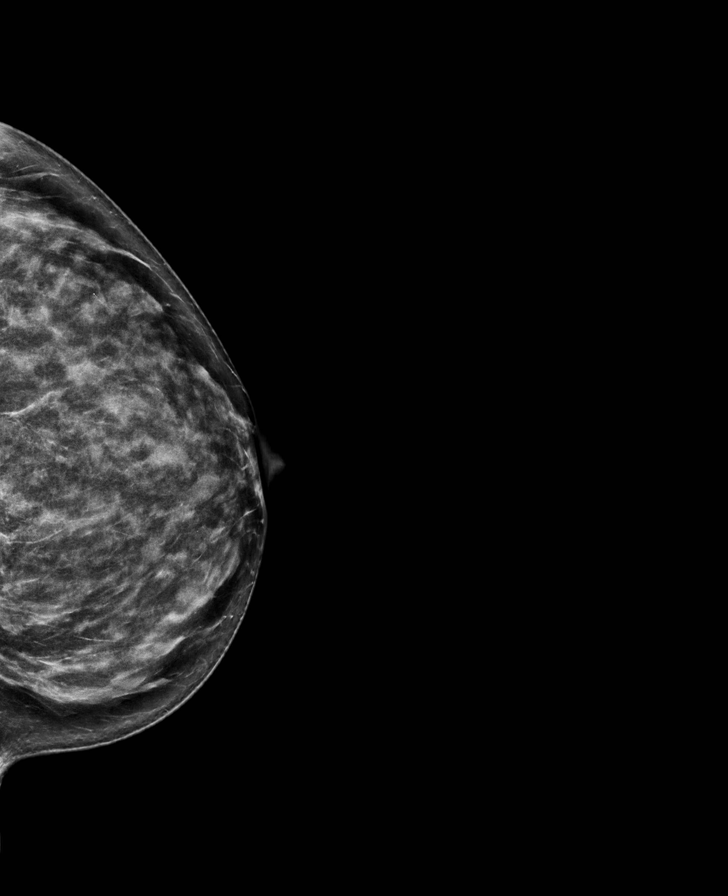

[R MLO synth-2D]
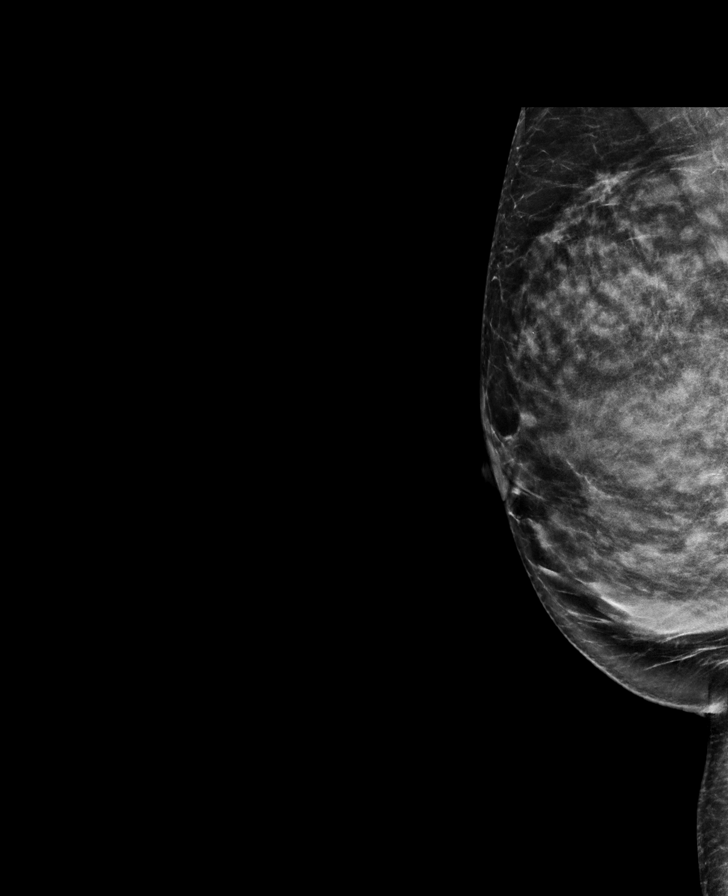

[R CC synth-2D]
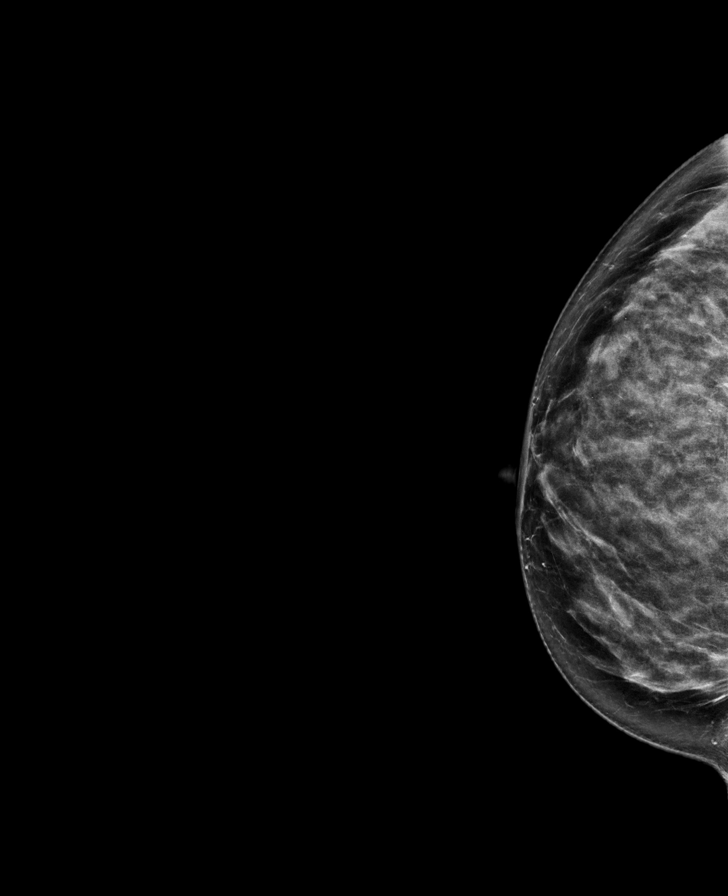

[L MLO synth-2D]
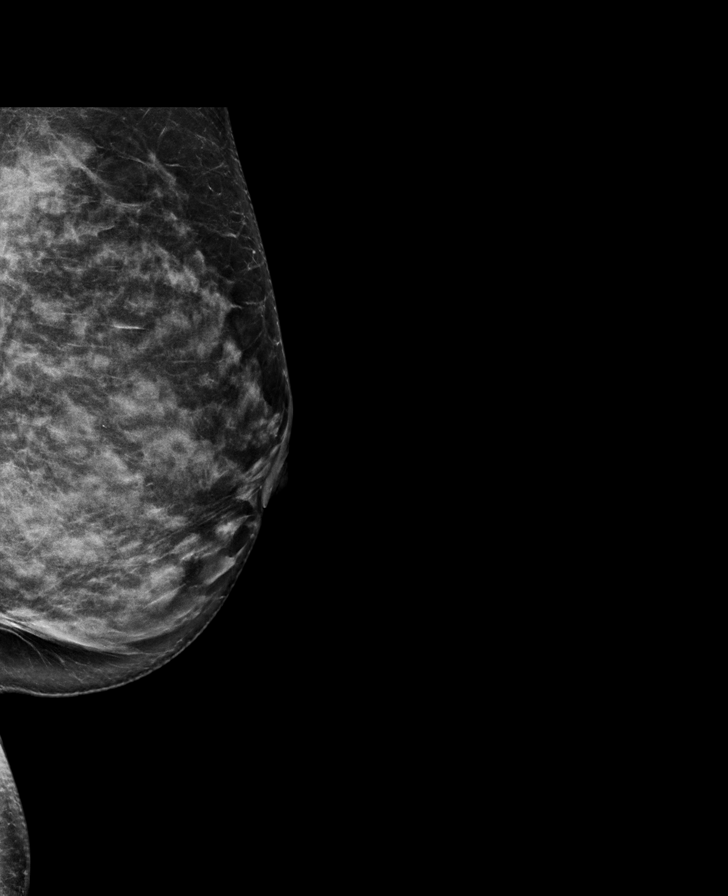

[L CC tomo · tomo slice 38/75.0]
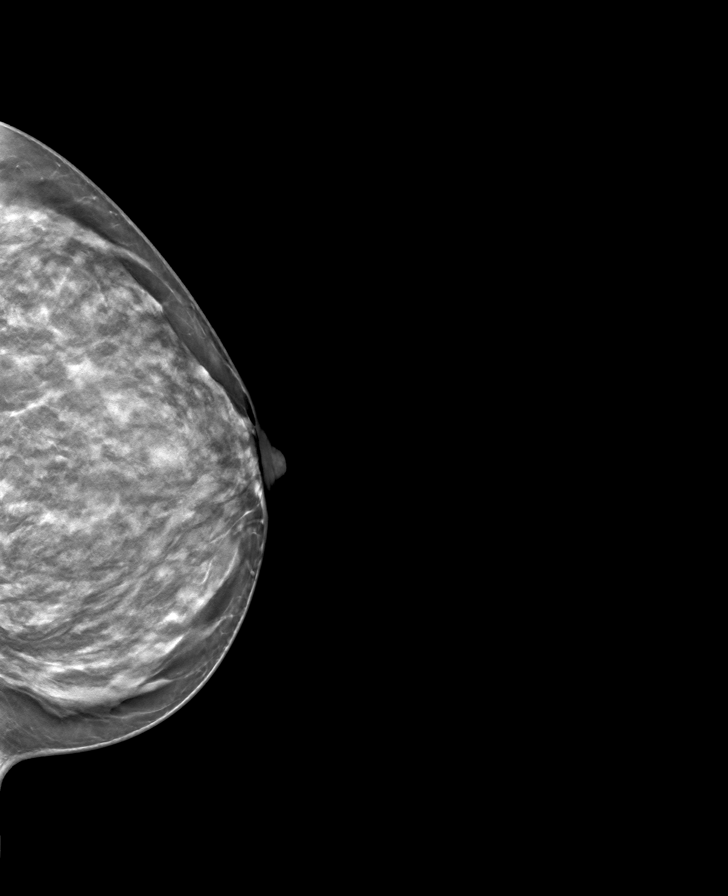

[R MLO tomo · tomo slice 37/72.0]
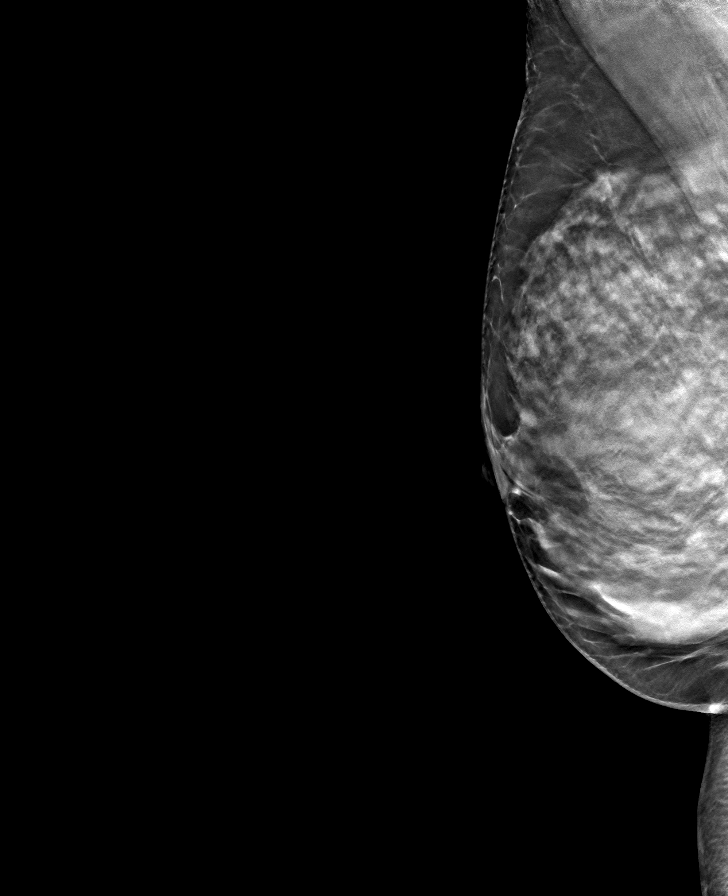

[L MLO tomo · tomo slice 38/75.0]
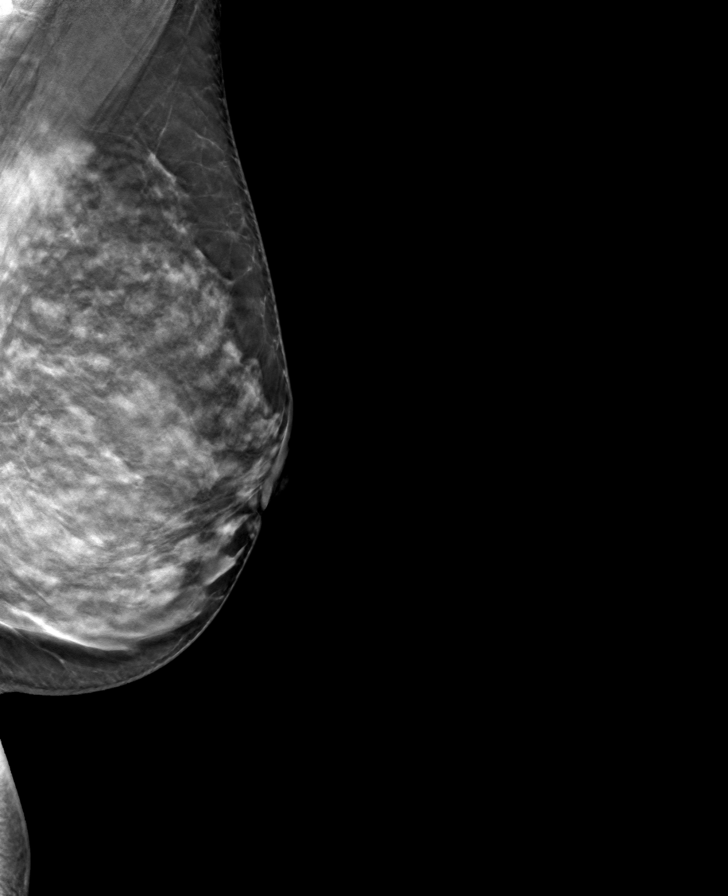

[R CC tomo · tomo slice 36/71.0]
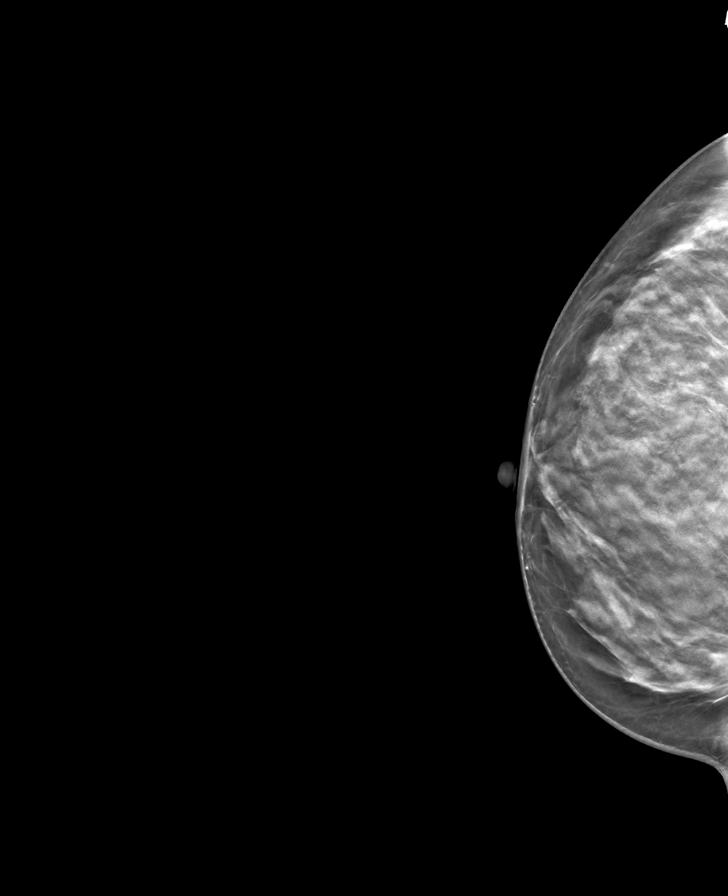

[8 of 24 positions shown; findings below may reference images not displayed]

ACR Breast Density Category d: The breast tissue is extremely dense,
which lowers the sensitivity of mammography
FINDINGS: There are no findings suspicious for malignancy. Images were
processed with CAD.
IMPRESSION: No mammographic evidence of malignancy. A result letter of this
screening mammogram will be mailed directly to the patient.

RECOMMENDATION:
Screening mammogram in one year. (Code:WO-0-ZI0)

BI-RADS CATEGORY  1: Negative.

## 2022-04-01 DIAGNOSIS — E538 Deficiency of other specified B group vitamins: Secondary | ICD-10-CM | POA: Diagnosis not present

## 2022-04-01 DIAGNOSIS — E063 Autoimmune thyroiditis: Secondary | ICD-10-CM | POA: Diagnosis not present

## 2022-04-01 DIAGNOSIS — E039 Hypothyroidism, unspecified: Secondary | ICD-10-CM | POA: Diagnosis not present

## 2022-04-01 DIAGNOSIS — E559 Vitamin D deficiency, unspecified: Secondary | ICD-10-CM | POA: Diagnosis not present

## 2022-04-19 DIAGNOSIS — E063 Autoimmune thyroiditis: Secondary | ICD-10-CM | POA: Diagnosis not present

## 2022-04-19 DIAGNOSIS — E039 Hypothyroidism, unspecified: Secondary | ICD-10-CM | POA: Diagnosis not present

## 2022-04-19 DIAGNOSIS — J4520 Mild intermittent asthma, uncomplicated: Secondary | ICD-10-CM | POA: Diagnosis not present

## 2022-04-19 DIAGNOSIS — E538 Deficiency of other specified B group vitamins: Secondary | ICD-10-CM | POA: Diagnosis not present

## 2022-06-12 ENCOUNTER — Ambulatory Visit: Payer: BC Managed Care – PPO | Admitting: Podiatry

## 2022-06-19 ENCOUNTER — Ambulatory Visit (INDEPENDENT_AMBULATORY_CARE_PROVIDER_SITE_OTHER): Payer: BC Managed Care – PPO | Admitting: Podiatry

## 2022-06-19 DIAGNOSIS — L601 Onycholysis: Secondary | ICD-10-CM

## 2022-06-19 NOTE — Progress Notes (Signed)
  Subjective:  Patient ID: Alice Turner, female    DOB: 1971-07-09,  MRN: [id number]  Chief Complaint  Patient presents with   Nail Problem     (np) right foot great toe was previous bruised/nail is trying to detach from nailbed    51 y.o. female presents with the above complaint. History confirmed with patient.   Objective:  Physical Exam: warm, good capillary refill, no trophic changes or ulcerative lesions, normal DP and PT pulses, normal sensory exam, and partial onycholysis of distal nail plate.  Assessment:   1. Onycholysis      Plan:  Patient was evaluated and treated and all questions answered.  Appears to have had a traumatic event resulting in new nail plate and partial onycholysis.  I recommend avulsion of the lysed portions, the hallux was prepped with alcohol and anesthetized with 1.5 cc each of 2% lidocaine and 0.5% Marcaine plain.  The loose portions of nail and remaining distal attachments were freed and the nail was excised.  Proximal nail appears to be healing well and growing in.  This was left intact.  No matricectomy was performed.  A dressing was applied and post care instructions were given.  She had a mild vasovagal reflex that she recovered from uneventfully.  Advised the nail take several months to regrow  Return if symptoms worsen or fail to improve.

## 2022-06-21 DIAGNOSIS — E039 Hypothyroidism, unspecified: Secondary | ICD-10-CM | POA: Diagnosis not present

## 2022-06-21 DIAGNOSIS — K229 Disease of esophagus, unspecified: Secondary | ICD-10-CM | POA: Diagnosis not present

## 2022-06-21 DIAGNOSIS — Z0001 Encounter for general adult medical examination with abnormal findings: Secondary | ICD-10-CM | POA: Diagnosis not present

## 2022-06-21 DIAGNOSIS — E063 Autoimmune thyroiditis: Secondary | ICD-10-CM | POA: Diagnosis not present

## 2022-06-21 DIAGNOSIS — Z1331 Encounter for screening for depression: Secondary | ICD-10-CM | POA: Diagnosis not present

## 2022-06-21 DIAGNOSIS — E559 Vitamin D deficiency, unspecified: Secondary | ICD-10-CM | POA: Diagnosis not present

## 2022-06-21 DIAGNOSIS — Z136 Encounter for screening for cardiovascular disorders: Secondary | ICD-10-CM | POA: Diagnosis not present

## 2022-08-09 DIAGNOSIS — R300 Dysuria: Secondary | ICD-10-CM | POA: Diagnosis not present

## 2022-08-09 DIAGNOSIS — N3000 Acute cystitis without hematuria: Secondary | ICD-10-CM | POA: Diagnosis not present

## 2022-09-24 ENCOUNTER — Other Ambulatory Visit: Payer: Self-pay | Admitting: Obstetrics and Gynecology

## 2022-09-24 DIAGNOSIS — Z1331 Encounter for screening for depression: Secondary | ICD-10-CM | POA: Diagnosis not present

## 2022-09-24 DIAGNOSIS — Z01419 Encounter for gynecological examination (general) (routine) without abnormal findings: Secondary | ICD-10-CM | POA: Diagnosis not present

## 2022-09-24 DIAGNOSIS — Z1231 Encounter for screening mammogram for malignant neoplasm of breast: Secondary | ICD-10-CM

## 2022-09-24 DIAGNOSIS — Z01411 Encounter for gynecological examination (general) (routine) with abnormal findings: Secondary | ICD-10-CM | POA: Diagnosis not present

## 2022-09-24 DIAGNOSIS — R232 Flushing: Secondary | ICD-10-CM | POA: Diagnosis not present

## 2022-09-27 DIAGNOSIS — E039 Hypothyroidism, unspecified: Secondary | ICD-10-CM | POA: Diagnosis not present

## 2022-09-27 DIAGNOSIS — E063 Autoimmune thyroiditis: Secondary | ICD-10-CM | POA: Diagnosis not present

## 2022-09-27 DIAGNOSIS — E538 Deficiency of other specified B group vitamins: Secondary | ICD-10-CM | POA: Diagnosis not present

## 2022-09-27 DIAGNOSIS — E559 Vitamin D deficiency, unspecified: Secondary | ICD-10-CM | POA: Diagnosis not present

## 2022-10-04 DIAGNOSIS — E063 Autoimmune thyroiditis: Secondary | ICD-10-CM | POA: Diagnosis not present

## 2022-10-04 DIAGNOSIS — E559 Vitamin D deficiency, unspecified: Secondary | ICD-10-CM | POA: Diagnosis not present

## 2022-10-04 DIAGNOSIS — J4520 Mild intermittent asthma, uncomplicated: Secondary | ICD-10-CM | POA: Diagnosis not present

## 2022-10-10 DIAGNOSIS — D225 Melanocytic nevi of trunk: Secondary | ICD-10-CM | POA: Diagnosis not present

## 2022-10-10 DIAGNOSIS — D2262 Melanocytic nevi of left upper limb, including shoulder: Secondary | ICD-10-CM | POA: Diagnosis not present

## 2022-10-10 DIAGNOSIS — R208 Other disturbances of skin sensation: Secondary | ICD-10-CM | POA: Diagnosis not present

## 2022-10-10 DIAGNOSIS — D2272 Melanocytic nevi of left lower limb, including hip: Secondary | ICD-10-CM | POA: Diagnosis not present

## 2022-10-10 DIAGNOSIS — L538 Other specified erythematous conditions: Secondary | ICD-10-CM | POA: Diagnosis not present

## 2022-10-10 DIAGNOSIS — L820 Inflamed seborrheic keratosis: Secondary | ICD-10-CM | POA: Diagnosis not present

## 2022-10-10 DIAGNOSIS — D2261 Melanocytic nevi of right upper limb, including shoulder: Secondary | ICD-10-CM | POA: Diagnosis not present

## 2022-10-23 DIAGNOSIS — R232 Flushing: Secondary | ICD-10-CM | POA: Diagnosis not present

## 2022-11-29 ENCOUNTER — Ambulatory Visit
Admission: RE | Admit: 2022-11-29 | Discharge: 2022-11-29 | Disposition: A | Discharge status: Home / Self Care | Payer: BC Managed Care – PPO | Source: Ambulatory Visit | Attending: Obstetrics and Gynecology | Admitting: Obstetrics and Gynecology

## 2022-11-29 DIAGNOSIS — Z1231 Encounter for screening mammogram for malignant neoplasm of breast: Secondary | ICD-10-CM | POA: Insufficient documentation

## 2022-12-09 ENCOUNTER — Ambulatory Visit
Admission: RE | Admit: 2022-12-09 | Discharge: 2022-12-09 | Disposition: A | Discharge status: Home / Self Care | Payer: BC Managed Care – PPO | Source: Ambulatory Visit | Attending: Obstetrics and Gynecology | Admitting: Obstetrics and Gynecology

## 2022-12-09 ENCOUNTER — Other Ambulatory Visit: Payer: Self-pay | Admitting: Obstetrics and Gynecology

## 2022-12-09 DIAGNOSIS — N6489 Other specified disorders of breast: Secondary | ICD-10-CM

## 2022-12-09 DIAGNOSIS — R928 Other abnormal and inconclusive findings on diagnostic imaging of breast: Secondary | ICD-10-CM

## 2022-12-09 DIAGNOSIS — R922 Inconclusive mammogram: Secondary | ICD-10-CM | POA: Diagnosis not present

## 2023-01-09 DIAGNOSIS — M47816 Spondylosis without myelopathy or radiculopathy, lumbar region: Secondary | ICD-10-CM | POA: Diagnosis not present

## 2023-01-09 DIAGNOSIS — M5137 Other intervertebral disc degeneration, lumbosacral region: Secondary | ICD-10-CM | POA: Diagnosis not present

## 2023-01-09 DIAGNOSIS — M419 Scoliosis, unspecified: Secondary | ICD-10-CM | POA: Diagnosis not present

## 2023-01-09 DIAGNOSIS — M5136 Other intervertebral disc degeneration, lumbar region: Secondary | ICD-10-CM | POA: Diagnosis not present

## 2023-01-09 DIAGNOSIS — M4187 Other forms of scoliosis, lumbosacral region: Secondary | ICD-10-CM | POA: Diagnosis not present

## 2023-01-09 DIAGNOSIS — M461 Sacroiliitis, not elsewhere classified: Secondary | ICD-10-CM | POA: Diagnosis not present

## 2023-01-09 DIAGNOSIS — M545 Low back pain, unspecified: Secondary | ICD-10-CM | POA: Diagnosis not present

## 2023-01-09 DIAGNOSIS — M62830 Muscle spasm of back: Secondary | ICD-10-CM | POA: Diagnosis not present

## 2023-02-21 DIAGNOSIS — M5416 Radiculopathy, lumbar region: Secondary | ICD-10-CM | POA: Diagnosis not present

## 2023-03-12 DIAGNOSIS — M5416 Radiculopathy, lumbar region: Secondary | ICD-10-CM | POA: Diagnosis not present

## 2023-03-14 DIAGNOSIS — M5416 Radiculopathy, lumbar region: Secondary | ICD-10-CM | POA: Diagnosis not present

## 2023-03-14 DIAGNOSIS — M533 Sacrococcygeal disorders, not elsewhere classified: Secondary | ICD-10-CM | POA: Diagnosis not present

## 2023-03-21 DIAGNOSIS — M533 Sacrococcygeal disorders, not elsewhere classified: Secondary | ICD-10-CM | POA: Diagnosis not present

## 2023-03-27 DIAGNOSIS — E559 Vitamin D deficiency, unspecified: Secondary | ICD-10-CM | POA: Diagnosis not present

## 2023-03-27 DIAGNOSIS — E538 Deficiency of other specified B group vitamins: Secondary | ICD-10-CM | POA: Diagnosis not present

## 2023-03-27 DIAGNOSIS — E039 Hypothyroidism, unspecified: Secondary | ICD-10-CM | POA: Diagnosis not present

## 2023-03-27 DIAGNOSIS — E063 Autoimmune thyroiditis: Secondary | ICD-10-CM | POA: Diagnosis not present

## 2023-04-03 DIAGNOSIS — E559 Vitamin D deficiency, unspecified: Secondary | ICD-10-CM | POA: Diagnosis not present

## 2023-04-03 DIAGNOSIS — K229 Disease of esophagus, unspecified: Secondary | ICD-10-CM | POA: Diagnosis not present

## 2023-04-03 DIAGNOSIS — J4520 Mild intermittent asthma, uncomplicated: Secondary | ICD-10-CM | POA: Diagnosis not present

## 2023-04-03 DIAGNOSIS — E039 Hypothyroidism, unspecified: Secondary | ICD-10-CM | POA: Diagnosis not present

## 2023-04-03 DIAGNOSIS — Z23 Encounter for immunization: Secondary | ICD-10-CM | POA: Diagnosis not present

## 2023-04-03 DIAGNOSIS — Z1331 Encounter for screening for depression: Secondary | ICD-10-CM | POA: Diagnosis not present

## 2023-04-03 DIAGNOSIS — Z136 Encounter for screening for cardiovascular disorders: Secondary | ICD-10-CM | POA: Diagnosis not present

## 2023-04-04 DIAGNOSIS — M533 Sacrococcygeal disorders, not elsewhere classified: Secondary | ICD-10-CM | POA: Diagnosis not present

## 2023-04-27 ENCOUNTER — Ambulatory Visit
Admission: EM | Admit: 2023-04-27 | Discharge: 2023-04-27 | Disposition: A | Discharge status: Home / Self Care | Payer: BC Managed Care – PPO

## 2023-04-27 DIAGNOSIS — W448XXA Other foreign body entering into or through a natural orifice, initial encounter: Secondary | ICD-10-CM

## 2023-04-27 DIAGNOSIS — T192XXA Foreign body in vulva and vagina, initial encounter: Secondary | ICD-10-CM

## 2023-04-27 NOTE — Discharge Instructions (Signed)
Follow up here or with your primary care provider if your symptoms are worsening or not improving.     

## 2023-04-27 NOTE — ED Triage Notes (Signed)
Patient states she might have left a tampon inserted and she has been having lower abd cramping.  Patient states she inserted the tampon on Friday and does not remember removing it.

## 2023-04-27 NOTE — ED Provider Notes (Signed)
[facility]    CSN: [id number] Arrival date & time: 04/27/23  1107      History   Chief Complaint No chief complaint on file.   HPI Alice Turner is a 52 y.o. female.   HPI  Presents to UC with concern for tampon possibly remaining in her vaginal canal x 2 days.  Patient states she went to bed without removing the tampon.  She is unsure if she awoke overnight and does not remember removing it.  She endorses pain.  PMH includes prolapsed cervix.  She is unsure if the discomfort she is feeling is from the missing tampon or from the cervix.  Past Medical History:  Diagnosis Date   Anemia    H/O   GERD (gastroesophageal reflux disease)     There are no problems to display for this patient.   Past Surgical History:  Procedure Laterality Date   COLONOSCOPY     CYST EXCISION     vocal cord   SEPTOPLASTY N/A 09/17/2016   Procedure: REPAIR OF SEPTAL PERFORATION;  Surgeon: Geanie Logan, MD;  Location: [facility];  Service: ENT;  Laterality: N/A;    OB History   No obstetric history on file.      Home Medications    Prior to Admission medications   Medication Sig Start Date End Date Taking? Authorizing Provider  albuterol (VENTOLIN HFA) 108 (90 Base) MCG/ACT inhaler SMARTSIG:2 Puff(s) By Mouth Every 6 Hours PRN 04/19/22   [provider]  Cyanocobalamin (VITAMIN B-12 IJ) Inject as directed every 30 (thirty) days.    [provider]  doxycycline (VIBRAMYCIN) 100 MG capsule 100 mg PO BID x 10 days Patient not taking: Reported on 10/05/2019 09/17/16   Geanie Logan, MD  HYDROcodone-acetaminophen (NORCO/VICODIN) 5-325 MG tablet Take 1-2 tablets by mouth every 6 (six) hours as needed for moderate pain. Patient not taking: Reported on 10/05/2019 09/17/16   Geanie Logan, MD  Norethindrone Acetate-Ethinyl Estrad-FE (LOESTRIN 24 FE) 1-20 MG-MCG(24) tablet Take 1 tablet by mouth daily. 04/20/19   [provider]  omeprazole (PRILOSEC)  40 MG capsule Take 40 mg by mouth daily.    [provider]  pantoprazole (PROTONIX) 40 MG tablet Take 40 mg by mouth daily.    [provider]  promethazine (PHENERGAN) 25 MG tablet Take 1 tablet (25 mg total) by mouth every 6 (six) hours as needed for nausea or vomiting. Patient not taking: Reported on 10/05/2019 09/17/16   Geanie Logan, MD  Vitamin D, Ergocalciferol, (DRISDOL) 50000 units CAPS capsule Take 50,000 Units by mouth every 7 (seven) days.    [provider]    Family History Family History  Problem Relation Age of Onset   Colon cancer Mother 35       mets to uterus   Breast cancer Maternal Aunt     Social History Social History   Tobacco Use   Smoking status: Never   Smokeless tobacco: Never  Substance Use Topics   Alcohol use: Yes    Alcohol/week: 3.0 standard drinks of alcohol    Types: 1 Glasses of wine, 1 Cans of beer, 1 Shots of liquor per week     Allergies   Codeine, Shellfish allergy, Amoxicillin, Penicillins, and Sulfa antibiotics   Review of Systems Review of Systems   Physical Exam Triage Vital Signs ED Triage Vitals  Enc Vitals Group     BP      Pulse      Resp  Temp      Temp src      SpO2      Weight      Height      Head Circumference      Peak Flow      Pain Score      Pain Loc      Pain Edu?      Excl. in GC?    No data found.  Updated Vital Signs There were no vitals taken for this visit.  Visual Acuity Right Eye Distance:   Left Eye Distance:   Bilateral Distance:    Right Eye Near:   Left Eye Near:    Bilateral Near:     Physical Exam Vitals reviewed. Exam conducted with a chaperone present.  Constitutional:      Appearance: Normal appearance.  Genitourinary:    General: Normal vulva.     Exam position: Lithotomy position.     Vagina: No foreign body.     Comments: Cervix appears prolapsed.  There is minor menstrual bleeding present in the vaginal canal and at the  labia. Skin:    General: Skin is warm and dry.  Neurological:     General: No focal deficit present.     Mental Status: She is alert and oriented to person, place, and time.  Psychiatric:        Mood and Affect: Mood normal.        Behavior: Behavior normal.      UC Treatments / Results  Labs (all labs ordered are listed, but only abnormal results are displayed) Labs Reviewed - No data to display  EKG   Radiology No results found.  Procedures Procedures (including critical care time)  Medications Ordered in UC Medications - No data to display  Initial Impression / Assessment and Plan / UC Course  I have reviewed the triage vital signs and the nursing notes.  Pertinent labs & imaging results that were available during my care of the patient were reviewed by me and considered in my medical decision making (see chart for details).   Alice Turner is a 52 y.o. female presenting with missing tampon. Patient is afebrile without recent antipyretics, satting well on room air. Overall is well appearing, well hydrated, without respiratory distress.   Reviewed relevant chart history.   Speculum inserted into the vaginal canal.  There is some minor menstrual bleeding present.  There are no foreign bodies in the canal.  A prolapsed cervix is visible.  Counseled patient on potential for adverse effects with medications prescribed/recommended today, ER and return-to-clinic precautions discussed, patient verbalized understanding and agreement with care plan.  Final Clinical Impressions(s) / UC Diagnoses   Final diagnoses:  None   Discharge Instructions   None    ED Prescriptions   None    PDMP not reviewed this encounter.   Charma Igo, [place] 04/27/23 1240

## 2023-04-30 DIAGNOSIS — E559 Vitamin D deficiency, unspecified: Secondary | ICD-10-CM | POA: Diagnosis not present

## 2023-04-30 DIAGNOSIS — E063 Autoimmune thyroiditis: Secondary | ICD-10-CM | POA: Diagnosis not present

## 2023-04-30 DIAGNOSIS — Z13820 Encounter for screening for osteoporosis: Secondary | ICD-10-CM | POA: Diagnosis not present

## 2023-04-30 DIAGNOSIS — K229 Disease of esophagus, unspecified: Secondary | ICD-10-CM | POA: Diagnosis not present

## 2023-04-30 DIAGNOSIS — E538 Deficiency of other specified B group vitamins: Secondary | ICD-10-CM | POA: Diagnosis not present

## 2023-04-30 DIAGNOSIS — E039 Hypothyroidism, unspecified: Secondary | ICD-10-CM | POA: Diagnosis not present

## 2023-04-30 LAB — HM DEXA SCAN: HM Dexa Scan: NORMAL

## 2023-05-02 DIAGNOSIS — N939 Abnormal uterine and vaginal bleeding, unspecified: Secondary | ICD-10-CM | POA: Diagnosis not present

## 2023-05-02 DIAGNOSIS — N949 Unspecified condition associated with female genital organs and menstrual cycle: Secondary | ICD-10-CM | POA: Diagnosis not present

## 2023-05-02 DIAGNOSIS — R102 Pelvic and perineal pain: Secondary | ICD-10-CM | POA: Diagnosis not present

## 2023-05-02 DIAGNOSIS — N719 Inflammatory disease of uterus, unspecified: Secondary | ICD-10-CM | POA: Diagnosis not present

## 2023-06-05 DIAGNOSIS — Z23 Encounter for immunization: Secondary | ICD-10-CM | POA: Diagnosis not present

## 2023-06-05 DIAGNOSIS — J4520 Mild intermittent asthma, uncomplicated: Secondary | ICD-10-CM | POA: Diagnosis not present

## 2023-06-05 DIAGNOSIS — E559 Vitamin D deficiency, unspecified: Secondary | ICD-10-CM | POA: Diagnosis not present

## 2023-06-05 DIAGNOSIS — E039 Hypothyroidism, unspecified: Secondary | ICD-10-CM | POA: Diagnosis not present

## 2023-06-13 ENCOUNTER — Ambulatory Visit: Payer: Self-pay | Admitting: *Deleted

## 2023-06-13 DIAGNOSIS — N938 Other specified abnormal uterine and vaginal bleeding: Secondary | ICD-10-CM | POA: Diagnosis not present

## 2023-06-13 DIAGNOSIS — N939 Abnormal uterine and vaginal bleeding, unspecified: Secondary | ICD-10-CM | POA: Diagnosis not present

## 2023-06-13 DIAGNOSIS — R102 Pelvic and perineal pain: Secondary | ICD-10-CM | POA: Diagnosis not present

## 2023-06-13 DIAGNOSIS — N9489 Other specified conditions associated with female genital organs and menstrual cycle: Secondary | ICD-10-CM | POA: Diagnosis not present

## 2023-06-13 NOTE — Telephone Encounter (Signed)
Summary: med request   Her primary Dr is retired  ----- Neurosurgeon from [facility] sent at 06/13/2023  2:47 PM EDT ----- Pt has a np on 10/30 she needs her med panaprozole, if she can get this before her appt     Attempted to call patient- left message to call nurse triage.

## 2023-06-13 NOTE — Telephone Encounter (Signed)
Second attempt to reach patient regarding her medication-no answer- left message to return office call

## 2023-06-13 NOTE — Telephone Encounter (Signed)
Patient called, left VM to return the call to the office regarding refill request. Sent her a MyChart message to do an E-visit or UC for the refill due to Dr. Roxan Hockey will not be able to refill until her new patient appointment. Unable to reach patient after 3 attempts by [facility] NT, will close encounter.   Summary: med request   Her primary Dr is retired  ----- Message from [facility] sent at 06/13/2023  2:47 PM EDT ----- Pt has a np on 10/30 she needs her med panaprozole, if she can get this before her appt

## 2023-06-16 NOTE — Telephone Encounter (Addendum)
Chief Complaint: Refill of pantoprazole Symptoms: None Frequency: N/A Pertinent Negatives: Patient denies N/A Disposition: [] ED /[] Urgent Care (no appt availability in office) / [] Appointment(In office/virtual)/ []  [facility]/ [x] Home Care/ [] Refused Recommended Disposition /[] [facility]/ []  Follow-up with PCP Additional Notes: Patient wanting Dr. Roxan Hockey to refill pantoprazole. Advised due to not yet an established patient the medication cannot be refilled. Advised UC, MyChart E-Visit, or Virtual UC visit. She says she may have enough to last, but if not she will purchase OTC omeprazole to use until she sees Dr. Roxan Hockey. She verbalized understanding of the above. She says she has her medical records and should she fax. I advised to hold on to them and bring them in on her visit, because she will receive a NP packet and that will include everything that is needed to fill out to get started with the practice. Advised after that information is sent, she may be able to present the medical records via fax or drop off. She says she will wait and touch base after the NP paperwork is received, if she needs to.   Reason for Disposition  Caller with prescription and triager answers question  Answer Assessment - Initial Assessment Questions 1. DRUG NAME: "What medicine do you need to have refilled?"     Pantoprazole 2. REFILLS REMAINING: "How many refills are remaining?" (Note: The label on the medicine or pill bottle will show how many refills are remaining. If there are no refills remaining, then a renewal may be needed.)     0 3. EXPIRATION DATE: "What is the expiration date?" (Note: The label states when the prescription will expire, and thus can no longer be refilled.)     N/A 4. PRESCRIBING HCP: "Who prescribed it?" Reason: If prescribed by specialist, call should be referred to that group.     Last PCP 5. SYMPTOMS: "Do you have any symptoms?"     N/A  Protocols used:  Medication Refill and Renewal Call-A-AH

## 2023-07-07 ENCOUNTER — Ambulatory Visit: Payer: Self-pay | Admitting: Family Medicine

## 2023-08-20 ENCOUNTER — Encounter: Payer: Self-pay | Admitting: Family Medicine

## 2023-08-20 ENCOUNTER — Ambulatory Visit (INDEPENDENT_AMBULATORY_CARE_PROVIDER_SITE_OTHER): Payer: 59 | Admitting: Family Medicine

## 2023-08-20 VITALS — BP 138/79 | HR 68 | Temp 98.1°F | Resp 16 | Ht 64.0 in | Wt 144.5 lb

## 2023-08-20 DIAGNOSIS — N951 Menopausal and female climacteric states: Secondary | ICD-10-CM | POA: Insufficient documentation

## 2023-08-20 DIAGNOSIS — K219 Gastro-esophageal reflux disease without esophagitis: Secondary | ICD-10-CM | POA: Diagnosis not present

## 2023-08-20 DIAGNOSIS — R030 Elevated blood-pressure reading, without diagnosis of hypertension: Secondary | ICD-10-CM | POA: Insufficient documentation

## 2023-08-20 DIAGNOSIS — M419 Scoliosis, unspecified: Secondary | ICD-10-CM | POA: Insufficient documentation

## 2023-08-20 DIAGNOSIS — E538 Deficiency of other specified B group vitamins: Secondary | ICD-10-CM | POA: Insufficient documentation

## 2023-08-20 DIAGNOSIS — Z7689 Persons encountering health services in other specified circumstances: Secondary | ICD-10-CM | POA: Diagnosis not present

## 2023-08-20 DIAGNOSIS — J45909 Unspecified asthma, uncomplicated: Secondary | ICD-10-CM | POA: Insufficient documentation

## 2023-08-20 DIAGNOSIS — M41129 Adolescent idiopathic scoliosis, site unspecified: Secondary | ICD-10-CM

## 2023-08-20 DIAGNOSIS — J4520 Mild intermittent asthma, uncomplicated: Secondary | ICD-10-CM

## 2023-08-20 DIAGNOSIS — I10 Essential (primary) hypertension: Secondary | ICD-10-CM | POA: Insufficient documentation

## 2023-08-20 DIAGNOSIS — N921 Excessive and frequent menstruation with irregular cycle: Secondary | ICD-10-CM

## 2023-08-20 DIAGNOSIS — E063 Autoimmune thyroiditis: Secondary | ICD-10-CM

## 2023-08-20 HISTORY — DX: Excessive and frequent menstruation with irregular cycle: N92.1

## 2023-08-20 MED ORDER — VITAMIN B-12 1000 MCG PO TABS: 1000.0000 ug | ORAL_TABLET | Freq: Every day | ORAL | Status: AC

## 2023-08-20 NOTE — Assessment & Plan Note (Signed)
Stable, not currently on medication. Patient has noted improvement with gluten reduction. Chronic, stable  Reviewed labs from June 2024, that were wnl   No current medications  -Continue current management and monitor for symptoms of hypothyroidism.

## 2023-08-20 NOTE — Assessment & Plan Note (Signed)
Chronic  Stable  Reviewed outside labs, last level >500 Recommended pt start oral supplementation with daily

## 2023-08-20 NOTE — Assessment & Plan Note (Signed)
Seasonal exacerbations and exposure to certain work environments trigger symptoms. Managed with Albuterol inhaler as needed. Chronic, stable  -Continue Albuterol as needed.

## 2023-08-20 NOTE — Assessment & Plan Note (Signed)
Welcomed patient to [facility]  Reviewed patient's medical history, medications, surgical and social history Discussed roles and expectations for primary care physician-patient relationship Recommended patient schedule annual preventative examinations   

## 2023-08-20 NOTE — Assessment & Plan Note (Signed)
Gastroesophageal Reflux Disease (GERD) Managed with daily Pantoprazole, occasionally twice daily. Chronic stable  -Continue Pantoprazole as needed.

## 2023-08-20 NOTE — Assessment & Plan Note (Signed)
Mild Scoliosis and Back Pain Chronic back pain managed with chiropractic care and occasional injections with [facility]. No recent imaging or treatment changes. -Continue current management and consider re-evaluation if symptoms worsen.

## 2023-08-20 NOTE — Progress Notes (Unsigned)
New patient visit   Patient: Alice Turner   DOB: 02-Jun-1971   52 y.o. Female  MRN: [id number] Visit Date: 08/20/2023  Today's healthcare provider: Ronnald Ramp, MD   No chief complaint on file.  Subjective    Alice Turner is a 52 y.o. female who presents today as a new patient to establish care.      Discussed the use of AI scribe software for clinical note transcription with the patient, who gave verbal consent to proceed.  History of Present Illness              Past Medical History:  Diagnosis Date   Anemia    H/O   GERD (gastroesophageal reflux disease)     Outpatient Medications Prior to Visit  Medication Sig   albuterol (VENTOLIN HFA) 108 (90 Base) MCG/ACT inhaler SMARTSIG:2 Puff(s) By Mouth Every 6 Hours PRN   Cyanocobalamin (VITAMIN B-12 IJ) Inject as directed every 30 (thirty) days.   doxycycline (VIBRAMYCIN) 100 MG capsule 100 mg PO BID x 10 days (Patient not taking: Reported on 10/05/2019)   HYDROcodone-acetaminophen (NORCO/VICODIN) 5-325 MG tablet Take 1-2 tablets by mouth every 6 (six) hours as needed for moderate pain. (Patient not taking: Reported on 10/05/2019)   Norethindrone Acetate-Ethinyl Estrad-FE (LOESTRIN 24 FE) 1-20 MG-MCG(24) tablet Take 1 tablet by mouth daily.   omeprazole (PRILOSEC) 40 MG capsule Take 40 mg by mouth daily.   pantoprazole (PROTONIX) 40 MG tablet Take 40 mg by mouth daily.   promethazine (PHENERGAN) 25 MG tablet Take 1 tablet (25 mg total) by mouth every 6 (six) hours as needed for nausea or vomiting. (Patient not taking: Reported on 10/05/2019)   Vitamin D, Ergocalciferol, (DRISDOL) 50000 units CAPS capsule Take 50,000 Units by mouth every 7 (seven) days.   No facility-administered medications prior to visit.    Past Surgical History:  Procedure Laterality Date   COLONOSCOPY     CYST EXCISION     vocal cord   SEPTOPLASTY N/A 09/17/2016   Procedure: REPAIR OF SEPTAL PERFORATION;  Surgeon: Geanie Logan, MD;  Location: [facility];  Service: ENT;  Laterality: N/A;   Family Status  Relation Name Status   Mother  (Not Specified)   Mat Aunt  (Not Specified)  No partnership data on file   Family History  Problem Relation Age of Onset   Colon cancer Mother 80       mets to uterus   Breast cancer Maternal Aunt    Social History   Socioeconomic History   Marital status: Married    Spouse name: Not on file   Number of children: Not on file   Years of education: Not on file   Highest education level: Associate degree: academic program  Occupational History   Not on file  Tobacco Use   Smoking status: Never   Smokeless tobacco: Never  Substance and Sexual Activity   Alcohol use: Yes    Alcohol/week: 3.0 standard drinks of alcohol    Types: 1 Glasses of wine, 1 Cans of beer, 1 Shots of liquor per week   Drug use: Not on file   Sexual activity: Not on file  Other Topics Concern   Not on file  Social History Narrative   Not on file   Social Determinants of Health   Financial Resource Strain: Low Risk  (08/19/2023)   Overall Financial Resource Strain (CARDIA)    Difficulty of Paying Living Expenses: Not hard at all  Food Insecurity: No Food Insecurity (08/19/2023)   Hunger Vital Sign    Worried About Running Out of Food in the Last Year: Never true    Ran Out of Food in the Last Year: Never true  Transportation Needs: No Transportation Needs (08/19/2023)   PRAPARE - Administrator, Civil Service (Medical): No    Lack of Transportation (Non-Medical): No  Physical Activity: Insufficiently Active (08/19/2023)   Exercise Vital Sign    Days of Exercise per Week: 3 days    Minutes of Exercise per Session: 30 min  Stress: Stress Concern Present (08/19/2023)   [employer] of Occupational Health - Occupational Stress Questionnaire    Feeling of Stress : Very much  Social Connections: Moderately Isolated (08/19/2023)   Social Connection and  Isolation Panel [NHANES]    Frequency of Communication with Friends and Family: Three times a week    Frequency of Social Gatherings with Friends and Family: Once a week    Attends Religious Services: Never    Database administrator or Organizations: No    Attends Engineer, structural: Not on file    Marital Status: Married     Allergies  Allergen Reactions   Codeine Other (See Comments)    "passed out"   Shellfish Allergy Hives and Swelling   Amoxicillin Rash   Penicillins Rash   Sulfa Antibiotics Rash    Immunization History  Administered Date(s) Administered   PFIZER Comirnaty(Gray Top)Covid-19 Tri-Sucrose Vaccine 12/28/2019, 01/18/2020    Health Maintenance  Topic Date Due   HIV Screening  Never done   Hepatitis C Screening  Never done   DTaP/Tdap/Td (1 - Tdap) Never done   Cervical Cancer Screening (HPV/Pap Cotest)  Never done   Colonoscopy  Never done   Zoster Vaccines- Shingrix (1 of 2) Never done   INFLUENZA VACCINE  Never done   COVID-19 Vaccine (3 - 2023-24 season) 06/22/2023   MAMMOGRAM  11/29/2024   HPV VACCINES  Aged Out    Patient Care Team: Alan Mulder, MD as PCP - General (Endocrinology)  Review of Systems  {Insert previous labs (optional):23779} {See past labs  Heme  Chem  Endocrine  Serology  Results Review (optional):1}   Objective    There were no vitals taken for this visit. {Insert last BP/Wt (optional):23777}{See vitals history (optional):1}    Depression Screen     No data to display         No results found for any visits on 08/20/23.   Physical Exam ***    Assessment & Plan      Problem List Items Addressed This Visit   None   Assessment and Plan               No follow-ups on file.      Ronnald Ramp, MD  [facility] [phone] (phone) [phone] (fax)  [facility]

## 2023-08-20 NOTE — Assessment & Plan Note (Signed)
  Managed with Progesterone nightly and Estradiol 0.75 transdermal patch twice weekly. -Continue current management with GYN as previously scheduled

## 2023-08-20 NOTE — Assessment & Plan Note (Signed)
Recent elevated readings, family history of heart disease. -Monitor blood pressure daily for the next month. Aim for readings under 130/80.  Follow-up appointment in one month to review and consider medication if necessary.

## 2023-08-20 NOTE — Patient Instructions (Signed)
VISIT SUMMARY:  You came in today to establish care and discuss your ongoing health concerns, including chronic back pain, hypothyroidism, gastroesophageal reflux disease (GERD), asthma, vitamin B12 deficiency, menopausal symptoms, and slightly elevated blood pressure. We reviewed your current treatments and made some adjustments to better manage your conditions.  YOUR PLAN:  -MILD SCOLIOSIS AND BACK PAIN: Mild scoliosis is a curvature of the spine that can cause chronic back pain. Continue with your current chiropractic care and injections as needed. If your symptoms worsen, we may need to re-evaluate your treatment plan.  -HYPOTHYROIDISM AND HASHIMOTO'S DISEASE: Hypothyroidism is when your thyroid gland doesn't produce enough hormones, and Hashimoto's disease is an autoimmune condition that affects the thyroid. You are currently stable without medication and have noted improvement with reducing gluten. Continue monitoring your symptoms.  -GASTROESOPHAGEAL REFLUX DISEASE (GERD): GERD is a condition where stomach acid frequently flows back into the tube connecting your mouth and stomach. Continue taking Pantoprazole daily, and you can take it twice daily during severe episodes.  -ASTHMA: Asthma is a condition in which your airways narrow and swell, producing extra mucus. Continue using your Albuterol inhaler as needed, especially during seasonal changes and exposure to strong smells.  -VITAMIN B12 DEFICIENCY: Vitamin B12 deficiency can cause fatigue and other symptoms. Your recent labs are stable, so we will transition you to daily oral B12 supplements instead of monthly injections.  -MENOPAUSE: Menopause is the time that marks the end of your menstrual cycles. Continue using your Progesterone nightly and Estradiol transdermal patch twice weekly to manage your symptoms.  -HYPERTENSION: Hypertension is high blood pressure, which can increase the risk of heart disease. Given your recent elevated  readings and family history, monitor your blood pressure daily aiming for readings under 130/80. We will review your readings in a follow-up appointment in one month and consider medication if necessary.  -GENERAL HEALTH MAINTENANCE: Continue taking Vitamin D 50,000 units weekly. Plan for your annual physical exam and consider scheduling a colonoscopy due to your family history of colon cancer. Obtain a flu shot after your upcoming travel.  INSTRUCTIONS:  Monitor your blood pressure daily for the next month, aiming for readings under 130/80. Schedule a follow-up appointment in one month to review your blood pressure readings and consider medication if necessary. Plan for an annual physical exam and consider scheduling a colonoscopy. Obtain a flu shot after your upcoming travel.

## 2023-09-17 ENCOUNTER — Ambulatory Visit: Payer: BC Managed Care – PPO | Admitting: Family Medicine

## 2023-09-17 ENCOUNTER — Encounter: Payer: Self-pay | Admitting: Family Medicine

## 2023-09-17 VITALS — BP 139/91 | HR 73 | Temp 98.0°F | Ht 64.0 in | Wt 145.0 lb

## 2023-09-17 DIAGNOSIS — B309 Viral conjunctivitis, unspecified: Secondary | ICD-10-CM | POA: Diagnosis not present

## 2023-09-17 DIAGNOSIS — I10 Essential (primary) hypertension: Secondary | ICD-10-CM | POA: Diagnosis not present

## 2023-09-17 DIAGNOSIS — S29011A Strain of muscle and tendon of front wall of thorax, initial encounter: Secondary | ICD-10-CM | POA: Diagnosis not present

## 2023-09-17 MED ORDER — POLYMYXIN B-TRIMETHOPRIM 10000-0.1 UNIT/ML-% OP SOLN: 2.0000 [drp] | Freq: Four times a day (QID) | OPHTHALMIC | 0 refills | Status: DC

## 2023-09-17 MED ORDER — MELOXICAM 7.5 MG PO TABS: 7.5000 mg | ORAL_TABLET | Freq: Every day | ORAL | 0 refills | Status: DC

## 2023-09-17 MED ORDER — VALSARTAN 40 MG PO TABS: 40.0000 mg | ORAL_TABLET | Freq: Every day | ORAL | 1 refills | Status: DC

## 2023-09-17 MED ORDER — AMLODIPINE BESYLATE 5 MG PO TABS: 5.0000 mg | ORAL_TABLET | Freq: Every day | ORAL | 1 refills | Status: DC

## 2023-09-17 NOTE — Progress Notes (Signed)
Established patient visit   Patient: Alice Turner   DOB: 1970-11-11   52 y.o. Female  MRN: [id number] Visit Date: 09/17/2023  Today's healthcare provider: Ronnald Ramp, MD   Chief Complaint  Patient presents with   Hypertension    Patient presents for follow up.  She states her readings at home have been high.   Conjunctivitis    Patient was with family that had 12 children under the age of 76.  She woke up yesterday with draining from her eye and redness.   Subjective     HPI     Hypertension    Additional comments: Patient presents for follow up.  She states her readings at home have been high.        Conjunctivitis    Additional comments: Patient was with family that had 12 children under the age of 38.  She woke up yesterday with draining from her eye and redness.      Last edited by Adline Peals, CMA on 09/17/2023  9:38 AM.       Discussed the use of AI scribe software for clinical note transcription with the patient, who gave verbal consent to proceed.  History of Present Illness   The patient is a 52 year old individual with a history of hypertension, who has been experiencing elevated home blood pressure readings. The patient has been keeping a log of her blood pressure readings, which have consistently been above 130/80, with some diastolic readings over 100. The patient reports that her blood pressure has been particularly high over the past week, which she attributes to increased stress due to a hacking incident at her workplace.  In addition to hypertension, the patient has been experiencing symptoms of conjunctivitis, primarily in the left eye. The patient woke up with redness and drainage from the eye after being around a large group of children. The patient also reports some discomfort in the chest muscle area, which she initially attributed to lifting a heavy carry-on during travel. However, the discomfort has persisted for three weeks  and is now noticeable during deep breaths.  The patient denies any allergies, headaches, blurry vision, chest pain, or ear pain. She reports no significant changes in her daily medication regimen, which includes pantoprazole and progesterone. The patient also takes a B12 vitamin supplement. The patient does not have a preference for the form of medication and is open to taking a combination pill for her hypertension.  The patient has a family history of heart failure and diabetes but does not have these conditions herself. She reports a lack of bladder control, which she is concerned may be exacerbated by a potential side effect of a dry cough from ACE inhibitors. The patient is not a heavy medication taker and is concerned about potential resistance to antibiotics for the conjunctivitis. She prefers to monitor the condition and only use eye drops if the symptoms worsen significantly.  The patient is also dealing with right-sided pectoral chest pain, which she initially thought was a muscle strain from lifting a heavy object. However, the pain has persisted for three weeks and is now noticeable during deep breaths.         Past Medical History:  Diagnosis Date   Anemia    H/O   Arthritis    Asthma    GERD (gastroesophageal reflux disease)    Menorrhagia with irregular cycle 08/20/2023   Thyroid disease     Medications: Outpatient Medications Prior to Visit  Medication Sig   albuterol (VENTOLIN HFA) 108 (90 Base) MCG/ACT inhaler SMARTSIG:2 Puff(s) By Mouth Every 6 Hours PRN   cyanocobalamin (VITAMIN B12) 1000 MCG tablet Take 1 tablet (1,000 mcg total) by mouth daily.   estradiol (VIVELLE-DOT) 0.075 MG/24HR 1 patch 2 (two) times a week.   pantoprazole (PROTONIX) 40 MG tablet Take 40 mg by mouth daily.   progesterone (PROMETRIUM) 200 MG capsule Take 200 mg by mouth at bedtime.   Vitamin D, Ergocalciferol, (DRISDOL) 50000 units CAPS capsule Take 50,000 Units by mouth every 7 (seven) days.    No facility-administered medications prior to visit.    Review of Systems        Objective    BP (!) 139/91 (BP Location: Right Arm, Patient Position: Sitting, Cuff Size: Normal)   Pulse 73   Temp 98 F (36.7 C) (Oral)   Ht 5\' 4"  (1.626 m)   Wt 145 lb (65.8 kg)   LMP 07/29/2023   SpO2 100%   BMI 24.89 kg/m  BP Readings from Last 3 Encounters:  09/17/23 (!) 139/91  08/20/23 138/79  04/27/23 (!) 153/89   Wt Readings from Last 3 Encounters:  09/17/23 145 lb (65.8 kg)  08/20/23 144 lb 8 oz (65.5 kg)  09/17/16 135 lb (61.2 kg)        Physical Exam HENT:     Nose: Nose normal.     Mouth/Throat:     Pharynx: Oropharynx is clear. No oropharyngeal exudate or posterior oropharyngeal erythema.  Eyes:     General: No scleral icterus.       Right eye: No discharge.        Left eye: Discharge present.    Extraocular Movements:     Right eye: Normal extraocular motion.     Left eye: Normal extraocular motion.     Conjunctiva/sclera:     Right eye: Right conjunctiva is injected.     Left eye: Left conjunctiva is injected.  Cardiovascular:     Rate and Rhythm: Normal rate and regular rhythm.     Heart sounds: Normal heart sounds. No murmur heard. Pulmonary:     Effort: Pulmonary effort is normal. No respiratory distress.     Breath sounds: Normal breath sounds. No wheezing, rhonchi or rales.  Chest:     Chest wall: Tenderness present. No mass.        No results found for any visits on 09/17/23.  Assessment & Plan     Problem List Items Addressed This Visit       Cardiovascular and Mediastinum   HTN (hypertension) - Primary    Elevated home blood pressures with diastolic values over 100. Increased stress due to work-related issues. No history of heart failure or diabetes. Discussed the importance of blood pressure control to prevent cardiovascular and renal complications. Explained benefits and side effects of amlodipine and valsartan. Amlodipine may cause  lower extremity swelling; valsartan may increase potassium and affect kidney function, requiring monitoring. Patient prefers to avoid medications causing dry cough due to liver issues. No preference between combination or separate pills. - Prescribe amlodipine 5 mg once daily - Prescribe valsartan 40 mg once daily - Follow up in 2-3 weeks to assess tolerance and effectiveness - Order blood work to check kidney function and potassium levels at follow-up      Relevant Medications   amLODipine (NORVASC) 5 MG tablet   valsartan (DIOVAN) 40 MG tablet     Musculoskeletal and Integument   Pectoralis muscle strain  Likely musculoskeletal due to recent lifting. Pain exacerbated by deep breaths and certain movements. No cardiac involvement. Discussed conservative management with heat therapy and gentle exercises. Offered meloxicam for inflammation. - Prescribe meloxicam 7.5 mg once daily - Advise heat therapy for 15-20 minutes daily - Recommend gentle exercises to maintain mobility - Reassess in 2-4 weeks; consider ultrasound if no improvement      Relevant Medications   meloxicam (MOBIC) 7.5 MG tablet     Other   Acute viral conjunctivitis    Redness, irritation, and drainage in the left eye, likely viral due to recent exposure to children and bilateral symptoms. No pain with eye movement or significant discharge suggestive of bacterial infection. Advised on conservative management with warm compresses and hygiene measures. Discussed potential need for antibiotic drops if symptoms worsen or become purulent. - Prescribe polytrim eye drops, 2 drops in left eye every 6 hours while awake if discharge worsens over the holiday weekend - Advise warm compresses and hand hygiene - Instruct to start eye drops if discharge becomes thick and yellow or symptoms worsen      Relevant Medications   trimethoprim-polymyxin b (POLYTRIM) ophthalmic solution          Return in about 2 weeks (around  10/01/2023) for HTN.         Ronnald Ramp, MD  [facility] [phone] (phone) [phone] (fax)  [facility]

## 2023-09-17 NOTE — Assessment & Plan Note (Signed)
Elevated home blood pressures with diastolic values over 100. Increased stress due to work-related issues. No history of heart failure or diabetes. Discussed the importance of blood pressure control to prevent cardiovascular and renal complications. Explained benefits and side effects of amlodipine and valsartan. Amlodipine may cause lower extremity swelling; valsartan may increase potassium and affect kidney function, requiring monitoring. Patient prefers to avoid medications causing dry cough due to liver issues. No preference between combination or separate pills. - Prescribe amlodipine 5 mg once daily - Prescribe valsartan 40 mg once daily - Follow up in 2-3 weeks to assess tolerance and effectiveness - Order blood work to check kidney function and potassium levels at follow-up

## 2023-09-17 NOTE — Assessment & Plan Note (Signed)
Redness, irritation, and drainage in the left eye, likely viral due to recent exposure to children and bilateral symptoms. No pain with eye movement or significant discharge suggestive of bacterial infection. Advised on conservative management with warm compresses and hygiene measures. Discussed potential need for antibiotic drops if symptoms worsen or become purulent. - Prescribe polytrim eye drops, 2 drops in left eye every 6 hours while awake if discharge worsens over the holiday weekend - Advise warm compresses and hand hygiene - Instruct to start eye drops if discharge becomes thick and yellow or symptoms worsen

## 2023-09-17 NOTE — Assessment & Plan Note (Signed)
Likely musculoskeletal due to recent lifting. Pain exacerbated by deep breaths and certain movements. No cardiac involvement. Discussed conservative management with heat therapy and gentle exercises. Offered meloxicam for inflammation. - Prescribe meloxicam 7.5 mg once daily - Advise heat therapy for 15-20 minutes daily - Recommend gentle exercises to maintain mobility - Reassess in 2-4 weeks; consider ultrasound if no improvement

## 2023-09-24 ENCOUNTER — Encounter: Payer: Self-pay | Admitting: Podiatry

## 2023-09-24 ENCOUNTER — Ambulatory Visit (INDEPENDENT_AMBULATORY_CARE_PROVIDER_SITE_OTHER): Payer: BC Managed Care – PPO | Admitting: Podiatry

## 2023-09-24 VITALS — Ht 64.0 in | Wt 145.0 lb

## 2023-09-24 DIAGNOSIS — L600 Ingrowing nail: Secondary | ICD-10-CM | POA: Diagnosis not present

## 2023-09-24 MED ORDER — NEOMYCIN-POLYMYXIN-HC 3.5-10000-1 OT SUSP: OTIC | 0 refills | Status: DC

## 2023-09-24 NOTE — Progress Notes (Unsigned)
  Subjective:  Patient ID: Alice Turner, female    DOB: Jan 30, 1971,  MRN: [id number]  Chief Complaint  Patient presents with   Ingrown Toenail    Patient is here for ingrown nail removed in the past on right hallux nail, tip of toe has become swollen and inflamed where new nail growth is pushing on the skin. Has had several blisters and is very bothersome    52 y.o. female presents with the above complaint. History confirmed with patient.   Objective:  Physical Exam: warm, good capillary refill, no trophic changes or ulcerative lesions, normal DP and PT pulses, normal sensory exam, and ingrown right hallux distal lateral border there is discoloration of the proximal nail.  Assessment:   1. Ingrown toenail of right foot      Plan:  Patient was evaluated and treated and all questions answered.    Ingrown Nail, right -Patient elects to proceed with minor surgery to remove ingrown toenail today. Consent reviewed and signed by patient. -Ingrown nail excised. See procedure note. -Educated on post-procedure care including soaking. Written instructions provided and reviewed. -Rx for Cortisporin sent to pharmacy. -Advised on signs and symptoms of infection developing.  We discussed that the phenol likely will create some redness and edema and tenderness around the nailbed as long as it is localized this is to be expected.  Will return as needed if any infection signs develop -The proximal nail discoloration was somewhat concerning for onychomycosis or bacterial infection I sent the nail to [facility] for PCR analysis, I will let her know the results of this and we will treat accordingly  Procedure: Excision of Ingrown Toenail Location: Right 1st toe lateral nail borders. Anesthesia: Lidocaine 1% plain; 1.5 mL and Marcaine 0.5% plain; 1.5 mL, digital block. Skin Prep: Betadine. Dressing: Silvadene; telfa; dry, sterile, compression dressing. Technique: Following skin prep, the toe  was exsanguinated and a tourniquet was secured at the base of the toe. The affected nail border was freed, split with a nail splitter, and excised. Chemical matrixectomy was then performed with phenol and irrigated out with alcohol. The tourniquet was then removed and sterile dressing applied. Disposition: Patient tolerated procedure well.    Return if symptoms worsen or fail to improve.

## 2023-09-24 NOTE — Patient Instructions (Addendum)

## 2023-09-25 ENCOUNTER — Telehealth: Payer: Self-pay

## 2023-09-25 NOTE — Telephone Encounter (Signed)
PA request received from [employer] for Neomycin-Polymyxin-HC 3.5 10000-1 suspension. Attempted to start PA through Cover[employer]Meds and I was automatically met with a message   Joycelyn Rua (Key: [id number])  Outcome Additional Information Required Drug is covered by current benefit plan. No further PA activity needed

## 2023-09-29 ENCOUNTER — Ambulatory Visit: Payer: BC Managed Care – PPO | Admitting: Podiatry

## 2023-10-01 ENCOUNTER — Ambulatory Visit: Payer: BC Managed Care – PPO | Admitting: Family Medicine

## 2023-10-01 ENCOUNTER — Encounter: Payer: Self-pay | Admitting: Family Medicine

## 2023-10-01 VITALS — BP 126/80 | HR 68 | Resp 18 | Ht 64.0 in | Wt 145.0 lb

## 2023-10-01 DIAGNOSIS — I10 Essential (primary) hypertension: Secondary | ICD-10-CM | POA: Diagnosis not present

## 2023-10-01 DIAGNOSIS — H1032 Unspecified acute conjunctivitis, left eye: Secondary | ICD-10-CM

## 2023-10-01 DIAGNOSIS — S29011D Strain of muscle and tendon of front wall of thorax, subsequent encounter: Secondary | ICD-10-CM | POA: Diagnosis not present

## 2023-10-01 MED ORDER — HYDROCHLOROTHIAZIDE 25 MG PO TABS: 12.5000 mg | ORAL_TABLET | Freq: Every day | ORAL | 3 refills | Status: DC

## 2023-10-01 NOTE — Assessment & Plan Note (Signed)
Chronic  Well controlled  Decreased amlodipine to 2.5mg   Continue valsartan 40mg   Will start hydrochlorothiazide 12.5mg  daily if BP is greater than 130/80 If leg swelling persists with lower amlodipine 2.5mg  and valsartan, start 12.5mg  hydrochlorothiazide with valsartan 40mg   Follow up with me in 3 weeks

## 2023-10-01 NOTE — Progress Notes (Signed)
Established patient visit   Patient: Alice Turner   DOB: 1970-12-17   52 y.o. Female  MRN: [id number] Visit Date: 10/01/2023  Today's healthcare provider: Ronnald Ramp, MD   Chief Complaint  Patient presents with   Hypertension   Subjective       Discussed the use of AI scribe software for clinical note transcription with the patient, who gave verbal consent to proceed.  History of Present Illness   The patient, a 52 year old individual with a history of hypertension, was last evaluated on September 17, 2023, with an elevated blood pressure of 139/91. She has been on a regimen of Valsartan 40mg  daily, which has been successful in controlling her hypertension, with a current blood pressure within the goal range at 120/80.  Over the past two weeks, the patient has been monitoring her blood pressure at home, with systolic readings ranging from 119 to 140, and diastolic readings varying from 79 to 91. She has noticed a recent onset of nocturnal lower extremity edema, particularly in the ankles, which was first observed the Saturday after Thanksgiving. The swelling is not present during the day but becomes noticeable in the evenings.  The patient has been adhering to her hypertension medication regimen, taking Valsartan in the evenings. She has also been mindful of her diet, particularly her salt intake, as she has been consuming more processed meats than usual. Despite this, she reports eating cleaner than she ever has.  In addition to hypertension, the patient has been experiencing a persistent redness in her left eye, which has not improved despite the use of eye drops. She also reports a spot on her chest that has not improved despite being careful not to strain it.  The patient works from home and spends approximately 10 hours a day sitting, which may contribute to the lower extremity edema. She has been trying to manage her symptoms and maintain her blood pressure  within the goal range. She is open to adjusting her medication regimen if necessary to better control her hypertension and manage her symptoms.         Past Medical History:  Diagnosis Date   Anemia    H/O   Arthritis    Asthma    GERD (gastroesophageal reflux disease)    Menorrhagia with irregular cycle 08/20/2023   Thyroid disease     Medications: Outpatient Medications Prior to Visit  Medication Sig   albuterol (VENTOLIN HFA) 108 (90 Base) MCG/ACT inhaler SMARTSIG:2 Puff(s) By Mouth Every 6 Hours PRN   amLODipine (NORVASC) 5 MG tablet Take 1 tablet (5 mg total) by mouth daily.   cyanocobalamin (VITAMIN B12) 1000 MCG tablet Take 1 tablet (1,000 mcg total) by mouth daily.   estradiol (VIVELLE-DOT) 0.075 MG/24HR 1 patch 2 (two) times a week.   meloxicam (MOBIC) 7.5 MG tablet Take 1 tablet (7.5 mg total) by mouth daily.   neomycin-polymyxin-hydrocortisone (CORTISPORIN) 3.5-10000-1 OTIC suspension Apply 1-2 drops daily after soaking and cover with bandaid   pantoprazole (PROTONIX) 40 MG tablet Take 40 mg by mouth daily.   progesterone (PROMETRIUM) 200 MG capsule Take 200 mg by mouth at bedtime.   valsartan (DIOVAN) 40 MG tablet Take 1 tablet (40 mg total) by mouth daily.   Vitamin D, Ergocalciferol, (DRISDOL) 50000 units CAPS capsule Take 50,000 Units by mouth every 7 (seven) days.   trimethoprim-polymyxin b (POLYTRIM) ophthalmic solution Place 2 drops into the left eye every 6 (six) hours. (Patient not taking: Reported on 10/01/2023)  No facility-administered medications prior to visit.    Review of Systems        Objective    BP 126/80   Pulse 68   Resp 18   Ht 5\' 4"  (1.626 m)   Wt 145 lb (65.8 kg)   SpO2 100%   BMI 24.89 kg/m  BP Readings from Last 3 Encounters:  10/01/23 126/80  09/17/23 (!) 139/91  08/20/23 138/79   Wt Readings from Last 3 Encounters:  10/01/23 145 lb (65.8 kg)  09/24/23 145 lb (65.8 kg)  09/17/23 145 lb (65.8 kg)        Physical  Exam Vitals reviewed.  Constitutional:      General: She is not in acute distress.    Appearance: Normal appearance. She is not ill-appearing, toxic-appearing or diaphoretic.  Eyes:     Conjunctiva/sclera: Conjunctivae normal.  Cardiovascular:     Rate and Rhythm: Normal rate and regular rhythm.     Pulses: Normal pulses.     Heart sounds: Normal heart sounds. No murmur heard.    No friction rub. No gallop.  Pulmonary:     Effort: Pulmonary effort is normal. No respiratory distress.     Breath sounds: Normal breath sounds. No stridor. No wheezing, rhonchi or rales.  Abdominal:     General: Bowel sounds are normal. There is no distension.     Palpations: Abdomen is soft.     Tenderness: There is no abdominal tenderness.  Musculoskeletal:     Right lower leg: No edema.     Left lower leg: No edema.     Comments: Right pectoral tenderness, no swelling,pain with ROM testing although pt demonstrates normal ROM   Skin:    Findings: No erythema or rash.  Neurological:     Mental Status: She is alert and oriented to person, place, and time.       No results found for any visits on 10/01/23.  Assessment & Plan     Problem List Items Addressed This Visit       Cardiovascular and Mediastinum   HTN (hypertension) - Primary    Chronic  Well controlled  Decreased amlodipine to 2.5mg   Continue valsartan 40mg   Will start hydrochlorothiazide 12.5mg  daily if BP is greater than 130/80 If leg swelling persists with lower amlodipine 2.5mg  and valsartan, start 12.5mg  hydrochlorothiazide with valsartan 40mg   Follow up with me in 3 weeks        Relevant Medications   hydrochlorothiazide (HYDRODIURIL) 25 MG tablet   Other Relevant Orders   [id number]+EGFR     Musculoskeletal and Integument   Pectoralis muscle strain    Persistent chest pain with no improvement despite conservative measures. Discussed potential need for imaging to rule out structural issues. - Follow-up with [facility]  for further evaluation and possible imaging. -pt to schedule ortho appt         Other   Acute conjunctivitis of left eye    Persistent redness in the left eye despite previous treatment with eye drops. No improvement noted after stopping the drops. No blurriness or vision changes reported. Discussed potential use of steroid eye drops but decided to refer to ophthalmologist for further evaluation. - Schedule with established ophthalmologist. - If unable to get an appointment, contact PCP for ophthalmology referral.           Return in about 3 weeks (around 10/22/2023) for HTN med.         Ronnald Ramp, MD  [facility] [phone] (phone) [phone] (fax)  [facility]

## 2023-10-01 NOTE — Assessment & Plan Note (Signed)
Persistent redness in the left eye despite previous treatment with eye drops. No improvement noted after stopping the drops. No blurriness or vision changes reported. Discussed potential use of steroid eye drops but decided to refer to ophthalmologist for further evaluation. - Schedule with established ophthalmologist. - If unable to get an appointment, contact PCP for ophthalmology referral.

## 2023-10-01 NOTE — Patient Instructions (Signed)
VISIT SUMMARY:  During today's visit, we reviewed your blood pressure management, discussed your recent symptoms, and planned further evaluations for your persistent chest pain and eye redness. Your blood pressure is currently well-controlled, but we addressed your concerns about evening ankle swelling and persistent redness in your left eye.  YOUR PLAN:  -CHEST PAIN: Chest pain can be a sign of various conditions, including heart or muscle issues. We discussed the need for further evaluation and imaging to rule out any structural problems. You will follow up with [facility] for this evaluation.  -HYPERTENSION: Hypertension, or high blood pressure, is being well-managed with your current medication, Valsartan. However, due to your evening ankle swelling, we will reduce your amlodipine dose and may add hydrochlorothiazide if needed. Continue taking Valsartan 40 mg daily, reduce amlodipine to 2.5 mg daily, and if your blood pressure exceeds 130/80 mmHg, start hydrochlorothiazide 12.5 mg daily. If swelling persists, stop amlodipine and continue Valsartan with hydrochlorothiazide. We will also check your metabolic panel today and follow up in three weeks.  -LEFT EYE CONJUNCTIVITIS: Conjunctivitis is an inflammation of the eye that causes redness. Since your symptoms have not improved with eye drops, we will refer you to an ophthalmologist for further evaluation. If you cannot get an appointment, please contact us for a referral.  INSTRUCTIONS:  Please follow up with [facility] for your chest pain evaluation. Continue your current medications as discussed, and monitor your blood pressure at home. Schedule an appointment with your ophthalmologist for your eye redness. We will see you in three weeks for a follow-up and in four months for your annual physical.

## 2023-10-01 NOTE — Assessment & Plan Note (Signed)
Persistent chest pain with no improvement despite conservative measures. Discussed potential need for imaging to rule out structural issues. - Follow-up with [facility] for further evaluation and possible imaging. -pt to schedule ortho appt

## 2023-10-02 ENCOUNTER — Encounter: Payer: Self-pay | Admitting: Podiatry

## 2023-10-02 DIAGNOSIS — H16042 Marginal corneal ulcer, left eye: Secondary | ICD-10-CM | POA: Diagnosis not present

## 2023-10-02 LAB — CMP14+EGFR
ALT: 16 [IU]/L (ref 0–32)
AST: 14 [IU]/L (ref 0–40)
Albumin: 4.3 g/dL (ref 3.9–4.9)
Alkaline Phosphatase: 82 [IU]/L (ref 44–121)
BUN/Creatinine Ratio: 11 (ref 9–23)
BUN: 10 mg/dL (ref 6–24)
Bilirubin Total: 0.4 mg/dL (ref 0.0–1.2)
CO2: 24 mmol/L (ref 20–29)
Calcium: 9.4 mg/dL (ref 8.7–10.2)
Chloride: 102 mmol/L (ref 96–106)
Creatinine, Ser: 0.87 mg/dL (ref 0.57–1.00)
Globulin, Total: 2.8 g/dL (ref 1.5–4.5)
Glucose: 96 mg/dL (ref 70–99)
Potassium: 4.6 mmol/L (ref 3.5–5.2)
Sodium: 140 mmol/L (ref 134–144)
Total Protein: 7.1 g/dL (ref 6.0–8.5)
eGFR: 81 mL/min/{1.73_m2} (ref >59)

## 2023-10-03 ENCOUNTER — Telehealth: Payer: Self-pay

## 2023-10-03 MED ORDER — FLUCONAZOLE 150 MG PO TABS: 150.0000 mg | ORAL_TABLET | ORAL | 0 refills | Status: AC

## 2023-10-03 NOTE — Telephone Encounter (Signed)
Received PA request for fluconazole 150mg  tablet. I need diagnosi in order to submit through covermymeds.

## 2023-10-06 NOTE — Telephone Encounter (Signed)
PA not needed. Drug is covered under current benefit plan

## 2023-10-23 DIAGNOSIS — D225 Melanocytic nevi of trunk: Secondary | ICD-10-CM | POA: Diagnosis not present

## 2023-10-23 DIAGNOSIS — D2272 Melanocytic nevi of left lower limb, including hip: Secondary | ICD-10-CM | POA: Diagnosis not present

## 2023-10-23 DIAGNOSIS — D2261 Melanocytic nevi of right upper limb, including shoulder: Secondary | ICD-10-CM | POA: Diagnosis not present

## 2023-10-23 DIAGNOSIS — D2262 Melanocytic nevi of left upper limb, including shoulder: Secondary | ICD-10-CM | POA: Diagnosis not present

## 2023-10-23 DIAGNOSIS — H16042 Marginal corneal ulcer, left eye: Secondary | ICD-10-CM | POA: Diagnosis not present

## 2023-10-24 DIAGNOSIS — M25511 Pain in right shoulder: Secondary | ICD-10-CM | POA: Diagnosis not present

## 2023-10-30 ENCOUNTER — Encounter: Payer: Self-pay | Admitting: Family Medicine

## 2023-10-30 ENCOUNTER — Ambulatory Visit (INDEPENDENT_AMBULATORY_CARE_PROVIDER_SITE_OTHER): Payer: BC Managed Care – PPO | Admitting: Family Medicine

## 2023-10-30 VITALS — BP 113/74 | HR 72 | Ht 64.0 in | Wt 143.0 lb

## 2023-10-30 DIAGNOSIS — Z114 Encounter for screening for human immunodeficiency virus [HIV]: Secondary | ICD-10-CM

## 2023-10-30 DIAGNOSIS — Z1159 Encounter for screening for other viral diseases: Secondary | ICD-10-CM

## 2023-10-30 DIAGNOSIS — Z131 Encounter for screening for diabetes mellitus: Secondary | ICD-10-CM | POA: Diagnosis not present

## 2023-10-30 DIAGNOSIS — Z13220 Encounter for screening for lipoid disorders: Secondary | ICD-10-CM | POA: Diagnosis not present

## 2023-10-30 DIAGNOSIS — K219 Gastro-esophageal reflux disease without esophagitis: Secondary | ICD-10-CM

## 2023-10-30 DIAGNOSIS — E538 Deficiency of other specified B group vitamins: Secondary | ICD-10-CM | POA: Diagnosis not present

## 2023-10-30 DIAGNOSIS — I10 Essential (primary) hypertension: Secondary | ICD-10-CM | POA: Diagnosis not present

## 2023-10-30 DIAGNOSIS — N951 Menopausal and female climacteric states: Secondary | ICD-10-CM

## 2023-10-30 DIAGNOSIS — Z0001 Encounter for general adult medical examination with abnormal findings: Secondary | ICD-10-CM

## 2023-10-30 DIAGNOSIS — E063 Autoimmune thyroiditis: Secondary | ICD-10-CM

## 2023-10-30 DIAGNOSIS — Z130 Encounter for screening for diseases of the blood and blood-forming organs and certain disorders involving the immune mechanism: Secondary | ICD-10-CM | POA: Diagnosis not present

## 2023-10-30 DIAGNOSIS — Z23 Encounter for immunization: Secondary | ICD-10-CM

## 2023-10-30 DIAGNOSIS — J4520 Mild intermittent asthma, uncomplicated: Secondary | ICD-10-CM | POA: Diagnosis not present

## 2023-10-30 DIAGNOSIS — Z0000 Encounter for general adult medical examination without abnormal findings: Secondary | ICD-10-CM | POA: Insufficient documentation

## 2023-10-30 DIAGNOSIS — E559 Vitamin D deficiency, unspecified: Secondary | ICD-10-CM

## 2023-10-30 DIAGNOSIS — Z1231 Encounter for screening mammogram for malignant neoplasm of breast: Secondary | ICD-10-CM

## 2023-10-30 NOTE — Progress Notes (Signed)
 Complete physical exam   Patient: Alice Turner   DOB: 1971-02-23   52 y.o. Female  MRN: [id number] Visit Date: 10/30/2023  Today's healthcare provider: Rockie Agent, MD   Chief Complaint  Patient presents with   Annual Exam   Subjective    Alice Turner is a 53 y.o. female who presents today for a complete physical exam.   She reports consuming a general diet.  Patient exercises regularly   She generally feels well.   She reports sleeping well.    She does not have additional problems to discuss today.   Discussed the use of AI scribe software for clinical note transcription with the patient, who gave verbal consent to proceed.  History of Present Illness   The patient, with a history of thyroid  condition and lung issues, presented for a routine physical and vaccination. She reported receiving the Shingrix vaccine in the past year, with the last dose administered in September. She also had a Pneumovax 23 vaccine, but the date was not specified.  The patient reported a recent issue with her right shoulder, for which she received an injection at an orthopedic clinic. The injection initially alleviated the discomfort, but the patient still experienced residual chest pressure and pain. She wondered if the pain could be breast-related, as she was due for a mammogram. Her last mammogram was in February of the previous year, which required a second scan due to an unspecified finding in the right breast.  The patient also reported a recent bacterial eye infection, which was 80% improved after treatment with eye drops. However, a small area of the eye remained unhealed, necessitating another three-week course of medication.  The patient was on a hormone patch for unspecified symptoms, which she reported was effective. However, she experienced bleeding cycles every two weeks, which was bothersome. She also took a weekly Vitamin D  prescription due to a history of low  levels, and a daily Vitamin B12 supplement instead of injections.  The patient was on a regimen of half a dose of Hydrochlorothiazide  (12.5mg ), full dose of Amlodipine  (5mg ), and Estradiol  for management of blood pressure and other unspecified conditions. She reported no swelling and her blood pressure was within normal limits at the time of the visit.         Past Medical History:  Diagnosis Date   Anemia    H/O   Arthritis    Asthma    GERD (gastroesophageal reflux disease)    Menorrhagia with irregular cycle 08/20/2023   Thyroid  disease    Past Surgical History:  Procedure Laterality Date   COLONOSCOPY     CYST EXCISION     vocal cord   EYE SURGERY     SEPTOPLASTY N/A 09/17/2016   Procedure: REPAIR OF SEPTAL PERFORATION;  Surgeon: Deward Dolly, MD;  Location: [facility];  Service: ENT;  Laterality: N/A;   Social History   Socioeconomic History   Marital status: Married    Spouse name: Not on file   Number of children: Not on file   Years of education: Not on file   Highest education level: Associate degree: academic program  Occupational History   Not on file  Tobacco Use   Smoking status: Never   Smokeless tobacco: Never  Vaping Use   Vaping status: Never Used  Substance and Sexual Activity   Alcohol use: Yes    Alcohol/week: 3.0 standard drinks of alcohol    Types: 2 Glasses of wine,  1 Shots of liquor per week   Drug use: Never   Sexual activity: Yes  Other Topics Concern   Not on file  Social History Narrative   Not on file   Social Drivers of Health   Financial Resource Strain: Low Risk  (10/29/2023)   Overall Financial Resource Strain (CARDIA)    Difficulty of Paying Living Expenses: Not hard at all  Food Insecurity: No Food Insecurity (10/29/2023)   Hunger Vital Sign    Worried About Running Out of Food in the Last Year: Never true    Ran Out of Food in the Last Year: Never true  Transportation Needs: No Transportation Needs (10/29/2023)    PRAPARE - Administrator, Civil Service (Medical): No    Lack of Transportation (Non-Medical): No  Physical Activity: Insufficiently Active (10/29/2023)   Exercise Vital Sign    Days of Exercise per Week: 2 days    Minutes of Exercise per Session: 20 min  Stress: Stress Concern Present (10/29/2023)   [employer] of Occupational Health - Occupational Stress Questionnaire    Feeling of Stress : Very much  Social Connections: Moderately Isolated (10/29/2023)   Social Connection and Isolation Panel [NHANES]    Frequency of Communication with Friends and Family: More than three times a week    Frequency of Social Gatherings with Friends and Family: Once a week    Attends Religious Services: Never    Database Administrator or Organizations: No    Attends Engineer, Structural: Not on file    Marital Status: Married  Catering Manager Violence: Not on file   Family Status  Relation Name Status   Mother  (Not Specified)   Father  (Not Specified)   Daughter  (Not Specified)   Son  (Not Specified)   Mat Aunt  (Not Specified)   Oceanographer  (Not Specified)   Nutritional Therapist  (Not Specified)   MGM  (Not Specified)   MGF  (Not Specified)   Other  (Not Specified)  No partnership data on file   Family History  Problem Relation Age of Onset   Asthma Mother    COPD Mother    Cancer Mother    Colon cancer Mother 31       mets to uterus   Diabetes Father    Heart disease Father    Anxiety disorder Daughter    Asthma Son    Anxiety disorder Son    Depression Maternal Aunt    Cancer Maternal Aunt    Breast cancer Maternal Aunt    Stroke Paternal Aunt    Heart disease Paternal Uncle    Cancer Maternal Grandmother    Stroke Maternal Grandmother    Cancer Maternal Grandfather    Diabetes Other    Anxiety disorder Other    Allergies  Allergen Reactions   Codeine Other (See Comments)    passed out   Shellfish Allergy Hives and Swelling   Amoxicillin Rash   Penicillins  Rash   Sulfa Antibiotics Rash     Medications: Outpatient Medications Prior to Visit  Medication Sig   albuterol (VENTOLIN HFA) 108 (90 Base) MCG/ACT inhaler SMARTSIG:2 Puff(s) By Mouth Every 6 Hours PRN   amLODipine  (NORVASC ) 5 MG tablet Take 1 tablet (5 mg total) by mouth daily.   cyanocobalamin  (VITAMIN B12) 1000 MCG tablet Take 1 tablet (1,000 mcg total) by mouth daily.   estradiol  (VIVELLE -DOT) 0.075 MG/24HR 1 patch 2 (two) times a week.  fluconazole  (DIFLUCAN ) 150 MG tablet Take 1 tablet (150 mg total) by mouth once a week for 26 doses.   hydrochlorothiazide  (HYDRODIURIL ) 25 MG tablet Take 0.5 tablets (12.5 mg total) by mouth daily.   meloxicam  (MOBIC ) 7.5 MG tablet Take 1 tablet (7.5 mg total) by mouth daily.   neomycin -polymyxin-hydrocortisone (CORTISPORIN) 3.5-10000-1 OTIC suspension Apply 1-2 drops daily after soaking and cover with bandaid   pantoprazole  (PROTONIX ) 40 MG tablet Take 40 mg by mouth daily.   progesterone (PROMETRIUM) 200 MG capsule Take 200 mg by mouth at bedtime.   trimethoprim -polymyxin b  (POLYTRIM ) ophthalmic solution Place 2 drops into the left eye every 6 (six) hours.   valsartan  (DIOVAN ) 40 MG tablet Take 1 tablet (40 mg total) by mouth daily.   Vitamin D , Ergocalciferol , (DRISDOL ) 50000 units CAPS capsule Take 50,000 Units by mouth every 7 (seven) days.   No facility-administered medications prior to visit.    Review of Systems  Last CBC No results found for: WBC, HGB, HCT, MCV, MCH, RDW, PLT Last metabolic panel Lab Results  Component Value Date   GLUCOSE 96 10/01/2023   NA 140 10/01/2023   K 4.6 10/01/2023   CL 102 10/01/2023   CO2 24 10/01/2023   BUN 10 10/01/2023   CREATININE 0.87 10/01/2023   EGFR 81 10/01/2023   CALCIUM 9.4 10/01/2023   PROT 7.1 10/01/2023   ALBUMIN 4.3 10/01/2023   LABGLOB 2.8 10/01/2023   BILITOT 0.4 10/01/2023   ALKPHOS 82 10/01/2023   AST 14 10/01/2023   ALT 16 10/01/2023   Last lipids No  results found for: CHOL, HDL, LDLCALC, LDLDIRECT, TRIG, CHOLHDL Last hemoglobin A1c No results found for: HGBA1C Last thyroid  functions No results found for: TSH, T3TOTAL, T4TOTAL, THYROIDAB Last vitamin D  No results found for: 25OHVITD2, 25OHVITD3, VD25OH     Objective    BP 113/74 (BP Location: Right Arm, Patient Position: Sitting, Cuff Size: Normal)   Pulse 72   Ht 5' 4 (1.626 m)   Wt 143 lb (64.9 kg)   SpO2 97%   BMI 24.55 kg/m  BP Readings from Last 3 Encounters:  10/30/23 113/74  10/01/23 126/80  09/17/23 (!) 139/91   Wt Readings from Last 3 Encounters:  10/30/23 143 lb (64.9 kg)  10/01/23 145 lb (65.8 kg)  09/24/23 145 lb (65.8 kg)        Physical Exam Vitals reviewed.  Constitutional:      General: She is not in acute distress.    Appearance: Normal appearance. She is not ill-appearing, toxic-appearing or diaphoretic.  HENT:     Head: Normocephalic and atraumatic.     Right Ear: Tympanic membrane and external ear normal. There is no impacted cerumen.     Left Ear: Tympanic membrane and external ear normal. There is no impacted cerumen.     Nose: Nose normal.     Mouth/Throat:     Pharynx: Oropharynx is clear.  Eyes:     General: No scleral icterus.    Extraocular Movements: Extraocular movements intact.     Conjunctiva/sclera: Conjunctivae normal.     Pupils: Pupils are equal, round, and reactive to light.  Cardiovascular:     Rate and Rhythm: Normal rate and regular rhythm.     Pulses: Normal pulses.     Heart sounds: Normal heart sounds. No murmur heard.    No friction rub. No gallop.  Pulmonary:     Effort: Pulmonary effort is normal. No respiratory distress.     Breath sounds:  Normal breath sounds. No wheezing, rhonchi or rales.  Chest:    Abdominal:     General: Bowel sounds are normal. There is no distension.     Palpations: Abdomen is soft. There is no mass.     Tenderness: There is no abdominal tenderness.  There is no guarding.  Musculoskeletal:        General: No deformity.     Cervical back: Normal range of motion and neck supple. No rigidity.     Right lower leg: No edema.     Left lower leg: No edema.  Lymphadenopathy:     Cervical: No cervical adenopathy.  Skin:    General: Skin is warm.     Capillary Refill: Capillary refill takes less than 2 seconds.     Findings: No erythema or rash.  Neurological:     General: No focal deficit present.     Mental Status: She is alert and oriented to person, place, and time.     Motor: No weakness.     Gait: Gait normal.  Psychiatric:        Mood and Affect: Mood normal.        Behavior: Behavior normal.      Last depression screening scores    09/17/2023    9:39 AM 08/20/2023    8:57 AM  PHQ 2/9 Scores  PHQ - 2 Score 0 0  PHQ- 9 Score 4     Last fall risk screening     No data to display          Last Audit-C alcohol use screening    10/29/2023    8:34 AM  Alcohol Use Disorder Test (AUDIT)  1. How often do you have a drink containing alcohol? 2  2. How many drinks containing alcohol do you have on a typical day when you are drinking? 0  3. How often do you have six or more drinks on one occasion? 0  AUDIT-C Score 2      Patient-reported   A score of 3 or more in women, and 4 or more in men indicates increased risk for alcohol abuse, EXCEPT if all of the points are from question 1   No results found for any visits on 10/30/23.  Assessment & Plan    Routine Health Maintenance and Physical Exam  Immunization History  Administered Date(s) Administered   Influenza, Seasonal, Injecte, Preservative Fre 10/30/2023   PFIZER Comirnaty(Gray Top)Covid-19 Tri-Sucrose Vaccine 12/28/2019, 01/18/2020   Tdap 01/03/2015    Health Maintenance  Topic Date Due   Pneumococcal Vaccine 23-84 Years old (1 of 2 - PCV) Never done   HIV Screening  Never done   Hepatitis C Screening  Never done   Cervical Cancer Screening (HPV/Pap  Cotest)  Never done   Zoster Vaccines- Shingrix (1 of 2) Never done   COVID-19 Vaccine (3 - 2024-25 season) 06/22/2023   Colonoscopy  09/27/2024   MAMMOGRAM  11/29/2024   DTaP/Tdap/Td (2 - Td or Tdap) 01/02/2025   INFLUENZA VACCINE  Completed   HPV VACCINES  Aged Out    Problem List Items Addressed This Visit       Cardiovascular and Mediastinum   HTN (hypertension)     Respiratory   Asthma     Endocrine   Hashimoto thyroiditis   Relevant Orders   TSH+T4F+T3Free     Other   Vitamin B12 deficiency   Relevant Orders   Vitamin B12   Vasomotor symptoms due to menopause  Relevant Orders   CBC   TSH+T4F+T3Free   Gastric reflux   Annual physical exam - Primary   Chronic conditions are stable  Patient was counseled on benefits of regular physical activity with goal of 150 minutes of moderate to vigurous intensity 4 days per week  Patient was counseled to consume well balanced diet of fruits, vegetables, limited saturated fats and limited sugary foods and beverages with emphasis on consuming 6-8 glasses of water daily  Screening recommended today: A1c, lipids,CBC,Hep C,HIV, TSH  Colon cancer screening: next due 09/2024  Cervical CA screening: recommended, pt to schedule with GYN    Mammogram UTD, last completed 12/09/22     Vaccines recommended today: COVID,Shingrix,Prevnar       Other Visit Diagnoses       Screening for HIV (human immunodeficiency virus)       Relevant Orders   HIV Antibody (routine testing w rflx)     Screening for lipid disorders       Relevant Orders   Lipid panel     Screening for diabetes mellitus       Relevant Orders   Hemoglobin A1c     Encounter for hepatitis C screening test for low risk patient       Relevant Orders   Hepatitis C antibody     Screening for deficiency anemia       Relevant Orders   CBC     Need for influenza vaccination       Relevant Orders   Flu Vaccine Trivalent High Dose (Fluad)     Vitamin D  deficiency        Relevant Orders   VITAMIN D  25 Hydroxy (Vit-D Deficiency, Fractures)     Encounter for screening mammogram for malignant neoplasm of breast       Relevant Orders   MM 3D SCREENING MAMMOGRAM BILATERAL BREAST     Encounter for immunization       Relevant Orders   Flu vaccine trivalent PF, 6mos and older(Flulaval,Afluria,Fluarix,Fluzone) (Completed)          Right Pectoral pain  Persistent right shoulder pain despite recent injection, with associated chest pain exacerbated by arm movement and pressure. Previously evaluated by orthopedics  Due for a mammogram; previous mammogram in February showed an area of concern in the right breast. - Order mammogram - Follow up with Dr. Verdon for further evaluation  Bacterial Conjunctivitis Bacterial eye infection treated with drops. 80% improvement noted, but some redness and irritation persist. Improving  - Continue current eye drop medication for two more weeks  Menorrhagia Bleeding cycles every two weeks. Continuing estradiol  patch and progesterone therapy. No new symptoms reported. - Continue current estradiol  and progesterone therapy -f/u with gyn  - Screen for anemia - Check thyroid  levels  Hypertension Blood pressure well-controlled with hydrochlorothiazide  12.5 mg, amlodipine  5 mg, and valsartan . - Continue current antihypertensive medications (hydrochlorothiazide  12.5 mg, amlodipine  5 mg, valsartan )  Vitamin D  Deficiency On weekly vitamin D  prescription. Plan to check levels. - Check vitamin D  levels  General Health Maintenance Routine physical examination. Administered flu shot. Discussed pneumococcal vaccine (Prevnar 20) due to lung history and thyroid  condition. Confirmed previous Shingrix vaccination. - Administer flu shot - Recommend pneumococcal vaccine (Prevnar 20) -mammogram ordered   General Health Maintenance Routine screenings and lab tests discussed. Recent metabolic panel showed normal liver and kidney  function. - Check cholesterol levels - Screen for diabetes - Check vitamin B12 levels      Return in about  6 months (around 04/28/2024) for HTN.       Rockie Agent, MD  [facility] [phone] (phone) [phone] (fax)  [facility]

## 2023-10-30 NOTE — Assessment & Plan Note (Addendum)
 Chronic conditions are stable  Patient was counseled on benefits of regular physical activity with goal of 150 minutes of moderate to vigurous intensity 4 days per week  Patient was counseled to consume well balanced diet of fruits, vegetables, limited saturated fats and limited sugary foods and beverages with emphasis on consuming 6-8 glasses of water daily  Screening recommended today: A1c, lipids,CBC,Hep C,HIV, TSH  Colon cancer screening: next due 09/2024  Cervical CA screening: recommended, pt to schedule with GYN    Mammogram UTD, last completed 12/09/22     Vaccines recommended today: COVID,Shingrix,Prevnar

## 2023-10-30 NOTE — Patient Instructions (Signed)
 It was a pleasure to see you today!  Thank you for choosing [facility] for your primary care.   Today you were seen for your annual physical  Please review the attached information regarding helpful preventive health topics.   To keep you healthy, please keep in mind the following health maintenance items that you are due for:   1.Pneumococcal vaccine  2. Shingrix vaccine  3. Pap smear for cervical cancer screening     Best Wishes,   Dr. Lang

## 2023-10-31 LAB — HEPATITIS C ANTIBODY: Hep C Virus Ab: NONREACTIVE

## 2023-10-31 LAB — VITAMIN D 25 HYDROXY (VIT D DEFICIENCY, FRACTURES): Vit D, 25-Hydroxy: 88.6 ng/mL (ref 30.0–100.0)

## 2023-10-31 LAB — CBC
Hematocrit: 41 % (ref 34.0–46.6)
Hemoglobin: 13.4 g/dL (ref 11.1–15.9)
MCH: 30.1 pg (ref 26.6–33.0)
MCHC: 32.7 g/dL (ref 31.5–35.7)
MCV: 92 fL (ref 79–97)
Platelets: 315 10*3/uL (ref 150–450)
RBC: 4.45 x10E6/uL (ref 3.77–5.28)
RDW: 11.9 % (ref 11.7–15.4)
WBC: 7.9 10*3/uL (ref 3.4–10.8)

## 2023-10-31 LAB — TSH+T4F+T3FREE
Free T4: 1.51 ng/dL (ref 0.82–1.77)
T3, Free: 2.8 pg/mL (ref 2.0–4.4)
TSH: 2.89 u[IU]/mL (ref 0.450–4.500)

## 2023-10-31 LAB — LIPID PANEL
Chol/HDL Ratio: 2.1 {ratio} (ref 0.0–4.4)
Cholesterol, Total: 173 mg/dL (ref 100–199)
HDL: 82 mg/dL (ref >39)
LDL Chol Calc (NIH): 80 mg/dL (ref 0–99)
Triglycerides: 53 mg/dL (ref 0–149)
VLDL Cholesterol Cal: 11 mg/dL (ref 5–40)

## 2023-10-31 LAB — HEMOGLOBIN A1C
Est. average glucose Bld gHb Est-mCnc: 117 mg/dL
Hgb A1c MFr Bld: 5.7 % — ABNORMAL HIGH (ref 4.8–5.6)

## 2023-10-31 LAB — HIV ANTIBODY (ROUTINE TESTING W REFLEX): HIV Screen 4th Generation wRfx: NONREACTIVE

## 2023-10-31 LAB — VITAMIN B12: Vitamin B-12: 1039 pg/mL (ref 232–1245)

## 2023-11-04 ENCOUNTER — Telehealth: Payer: Self-pay

## 2023-11-04 DIAGNOSIS — Z1231 Encounter for screening mammogram for malignant neoplasm of breast: Secondary | ICD-10-CM

## 2023-11-04 NOTE — Telephone Encounter (Signed)
 Copied from CRM [id number]. Topic: General - Other >> Nov 04, 2023 10:12 AM Tiffany B wrote: Reason for CRM: Patient  was last seen by PCP on 10/30/2023 and PCP conducted a breast examination and felt something. Caller would like to know if PCP will send mamo orders to [facility].

## 2023-11-26 DIAGNOSIS — B301 Conjunctivitis due to adenovirus: Secondary | ICD-10-CM | POA: Diagnosis not present

## 2023-11-26 DIAGNOSIS — H16042 Marginal corneal ulcer, left eye: Secondary | ICD-10-CM | POA: Diagnosis not present

## 2023-12-09 ENCOUNTER — Ambulatory Visit
Admission: RE | Admit: 2023-12-09 | Discharge: 2023-12-09 | Disposition: A | Discharge status: Home / Self Care | Payer: BC Managed Care – PPO | Source: Ambulatory Visit | Attending: Family Medicine | Admitting: Family Medicine

## 2023-12-09 DIAGNOSIS — Z1231 Encounter for screening mammogram for malignant neoplasm of breast: Secondary | ICD-10-CM | POA: Insufficient documentation

## 2023-12-12 ENCOUNTER — Other Ambulatory Visit: Payer: Self-pay | Admitting: Family Medicine

## 2023-12-12 MED ORDER — VITAMIN D (ERGOCALCIFEROL) 1.25 MG (50000 UNIT) PO CAPS: 50000.0000 [IU] | ORAL_CAPSULE | ORAL | 0 refills | Status: DC

## 2023-12-16 ENCOUNTER — Other Ambulatory Visit: Payer: Self-pay | Admitting: Family Medicine

## 2023-12-17 MED ORDER — PANTOPRAZOLE SODIUM 40 MG PO TBEC: 40.0000 mg | DELAYED_RELEASE_TABLET | Freq: Every day | ORAL | 4 refills | Status: DC

## 2024-01-15 ENCOUNTER — Other Ambulatory Visit: Payer: Self-pay | Admitting: Family Medicine

## 2024-01-16 NOTE — Telephone Encounter (Signed)
 Her recent vit d level was 88. She can continue taking OTC vid D3 1000-2000 units daily.

## 2024-01-20 ENCOUNTER — Other Ambulatory Visit: Payer: Self-pay | Admitting: Family Medicine

## 2024-01-20 MED ORDER — VITAMIN D (ERGOCALCIFEROL) 1.25 MG (50000 UNIT) PO CAPS: 50000.0000 [IU] | ORAL_CAPSULE | ORAL | 0 refills | Status: DC

## 2024-02-24 ENCOUNTER — Other Ambulatory Visit: Payer: Self-pay | Admitting: Family Medicine

## 2024-02-24 ENCOUNTER — Telehealth: Payer: Self-pay | Admitting: Family Medicine

## 2024-02-24 DIAGNOSIS — I10 Essential (primary) hypertension: Secondary | ICD-10-CM

## 2024-02-24 NOTE — Telephone Encounter (Signed)
 [facility] is requesting refill pantoprazole  (PROTONIX ) 40 MG tablet  & Vitamin D , Ergocalciferol , (DRISDOL ) 1.25 MG (50000 UNIT) CAPS capsule  & valsartan  (DIOVAN ) 40 MG tablet  & amLODipine  (NORVASC ) 5 MG tablet   & hydrochlorothiazide  (HYDRODIURIL ) 25 MG tablet   Pharmacy not listed/wont pull up [facility]  [address] [place], [place] [address] Please advise

## 2024-02-25 DIAGNOSIS — B301 Conjunctivitis due to adenovirus: Secondary | ICD-10-CM | POA: Diagnosis not present

## 2024-02-25 NOTE — Telephone Encounter (Signed)
 LOV 1*9*25 NOV none on file [facility]  LABS 1*9*25

## 2024-02-27 MED ORDER — PANTOPRAZOLE SODIUM 40 MG PO TBEC: 40.0000 mg | DELAYED_RELEASE_TABLET | Freq: Every day | ORAL | 3 refills | Status: DC

## 2024-02-27 MED ORDER — AMLODIPINE BESYLATE 5 MG PO TABS: 5.0000 mg | ORAL_TABLET | Freq: Every day | ORAL | 3 refills | Status: AC

## 2024-02-27 MED ORDER — HYDROCHLOROTHIAZIDE 25 MG PO TABS: 12.5000 mg | ORAL_TABLET | Freq: Every day | ORAL | 3 refills | Status: AC

## 2024-02-27 MED ORDER — VALSARTAN 40 MG PO TABS: 40.0000 mg | ORAL_TABLET | Freq: Every day | ORAL | 3 refills | Status: AC

## 2024-03-08 ENCOUNTER — Other Ambulatory Visit: Payer: Self-pay | Admitting: Family Medicine

## 2024-03-08 NOTE — Telephone Encounter (Signed)
 [facility] requesting refill Vitamin D , Ergocalciferol , (DRISDOL ) 1.25 MG (50000 UNIT) CAPS capsule  Please advise

## 2024-03-13 ENCOUNTER — Other Ambulatory Visit: Payer: Self-pay | Admitting: Family Medicine

## 2024-03-13 DIAGNOSIS — I10 Essential (primary) hypertension: Secondary | ICD-10-CM

## 2024-03-18 DIAGNOSIS — L538 Other specified erythematous conditions: Secondary | ICD-10-CM | POA: Diagnosis not present

## 2024-03-18 DIAGNOSIS — R208 Other disturbances of skin sensation: Secondary | ICD-10-CM | POA: Diagnosis not present

## 2024-03-18 DIAGNOSIS — L820 Inflamed seborrheic keratosis: Secondary | ICD-10-CM | POA: Diagnosis not present

## 2024-04-21 DIAGNOSIS — H0014 Chalazion left upper eyelid: Secondary | ICD-10-CM | POA: Diagnosis not present

## 2024-04-29 ENCOUNTER — Ambulatory Visit (INDEPENDENT_AMBULATORY_CARE_PROVIDER_SITE_OTHER): Payer: Self-pay | Admitting: Family Medicine

## 2024-04-29 VITALS — BP 106/73 | HR 78 | Ht 64.0 in | Wt 140.0 lb

## 2024-04-29 DIAGNOSIS — I10 Essential (primary) hypertension: Secondary | ICD-10-CM | POA: Diagnosis not present

## 2024-04-29 DIAGNOSIS — R110 Nausea: Secondary | ICD-10-CM

## 2024-04-29 DIAGNOSIS — E538 Deficiency of other specified B group vitamins: Secondary | ICD-10-CM | POA: Diagnosis not present

## 2024-04-29 DIAGNOSIS — K219 Gastro-esophageal reflux disease without esophagitis: Secondary | ICD-10-CM | POA: Diagnosis not present

## 2024-04-29 DIAGNOSIS — J4520 Mild intermittent asthma, uncomplicated: Secondary | ICD-10-CM

## 2024-04-29 MED ORDER — ONDANSETRON 4 MG PO TBDP: 4.0000 mg | ORAL_TABLET | Freq: Three times a day (TID) | ORAL | 2 refills | Status: AC | PRN

## 2024-04-29 NOTE — Progress Notes (Signed)
 Established patient visit   Patient: Alice Turner   DOB: Mar 13, 1971   53 y.o. Female  MRN: [id number] Visit Date: 04/29/2024  Today's healthcare provider: Rockie Agent, MD   Chief Complaint  Patient presents with   Hypertension   Subjective       Discussed the use of AI scribe software for clinical note transcription with the patient, who gave verbal consent to proceed.  History of Present Illness Alice Turner is a 53 year old female with chronic hypertension who presents for a follow-up visit.  Her blood pressure is well controlled on her current regimen, which includes amlodipine  5 mg daily and hydrochlorothiazide  12.5 mg daily. She had previously experienced leg swelling, which led to a reduction in her amlodipine  dose. She also takes valsartan  40 mg daily in the evening. Two days ago, she experienced a transient episode of elevated blood pressure, with a reading of 149/92 mmHg, accompanied by nausea and a 'funny' feeling in her head. This occurred before she had taken her medication for the day. After taking her medication, her blood pressure normalized to below 130/80 mmHg. She has not been regularly checking her blood pressure in the evenings but notes that it has been within goal range when checked. She reports no recent leg swelling, lightheadedness, dizziness, or wooziness.  She has a history of acid reflux and takes Protonix  40 mg for management. She experiences nausea at night, particularly if she eats late or consumes certain foods or drinks during the day. She has been monitoring her sodium intake to help manage her symptoms. Occasional nausea and vomiting related to her acid reflux.  She has a history of asthma, which is triggered by strong chemical smells, perfumes, and environmental factors such as yard work. She uses her inhaler a couple of times a month, particularly when exposed to these triggers.  She previously took estradiol  and progesterone for  lengthy menstrual cycles but stopped in January due to prolonged bleeding and anemia. She experiences hot flashes.  She has been taking vitamin D  supplements but only received one high-dose pill from her prescription. She plans to switch to over-the-counter vitamin D  supplements.  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     Past Medical History:  Diagnosis Date   Anemia    H/O   Arthritis    Asthma    GERD (gastroesophageal reflux disease)    Menorrhagia with irregular cycle 08/20/2023   Thyroid  disease     Medications: Outpatient Medications Prior to Visit  Medication Sig   amLODipine  (NORVASC ) 5 MG tablet Take 1 tablet (5 mg total) by mouth daily.   hydrochlorothiazide  (HYDRODIURIL ) 25 MG tablet Take 0.5 tablets (12.5 mg total) by mouth daily.   pantoprazole  (PROTONIX ) 40 MG tablet Take 1 tablet (40 mg total) by mouth daily.   valsartan  (DIOVAN ) 40 MG tablet Take 1 tablet (40 mg total) by mouth daily.   albuterol (VENTOLIN HFA) 108 (90 Base) MCG/ACT inhaler SMARTSIG:2 Puff(s) By Mouth Every 6 Hours PRN   cyanocobalamin  (VITAMIN B12) 1000 MCG tablet Take 1 tablet (1,000 mcg total) by mouth daily.   [DISCONTINUED] estradiol  (VIVELLE -DOT) 0.075 MG/24HR 1 patch 2 (two) times a week.   [DISCONTINUED] meloxicam  (MOBIC ) 7.5 MG tablet Take 1 tablet (7.5 mg total) by mouth daily.   [DISCONTINUED] neomycin -polymyxin-hydrocortisone (CORTISPORIN) 3.5-10000-1 OTIC suspension Apply 1-2 drops daily after soaking and cover with bandaid   [DISCONTINUED]  progesterone (PROMETRIUM) 200 MG capsule Take 200 mg by mouth at bedtime.   [DISCONTINUED] trimethoprim -polymyxin b  (POLYTRIM ) ophthalmic solution Place 2 drops into the left eye every 6 (six) hours.   [DISCONTINUED] Vitamin D , Ergocalciferol , (DRISDOL ) 1.25 MG (50000 UNIT) CAPS capsule Take 1 capsule (50,000 Units total) by mouth every 7 (seven) days.   No  facility-administered medications prior to visit.    Review of Systems  Last metabolic panel Lab Results  Component Value Date   GLUCOSE 96 10/01/2023   NA 140 10/01/2023   K 4.6 10/01/2023   CL 102 10/01/2023   CO2 24 10/01/2023   BUN 10 10/01/2023   CREATININE 0.87 10/01/2023   EGFR 81 10/01/2023   CALCIUM 9.4 10/01/2023   PROT 7.1 10/01/2023   ALBUMIN 4.3 10/01/2023   LABGLOB 2.8 10/01/2023   BILITOT 0.4 10/01/2023   ALKPHOS 82 10/01/2023   AST 14 10/01/2023   ALT 16 10/01/2023        Objective    BP 106/73   Pulse 78   Ht 5' 4 (1.626 m)   Wt 140 lb (63.5 kg)   BMI 24.03 kg/m  BP Readings from Last 3 Encounters:  04/29/24 106/73  10/30/23 113/74  10/01/23 126/80   Wt Readings from Last 3 Encounters:  04/29/24 140 lb (63.5 kg)  10/30/23 143 lb (64.9 kg)  10/01/23 145 lb (65.8 kg)        Physical Exam  Physical Exam VITALS: BP- 106/73 GENERAL: No acute distress. CHEST: Lungs clear to auscultation bilaterally. CARDIOVASCULAR: Regular rate and rhythm, no murmurs. ABDOMEN: Normal bowel sounds, no tenderness, no distention. EXTREMITIES: No lower extremity edema.    No results found for any visits on 04/29/24.  Assessment & Plan     Problem List Items Addressed This Visit       Cardiovascular and Mediastinum   HTN (hypertension) - Primary   Chronic hypertension, well-controlled on current regimen. Blood pressure today is 106/73 mmHg, within goal range. Previous adjustment of amlodipine  due to leg swelling, which has resolved. Recent episode of elevated blood pressure (149/92 mmHg) associated with nausea and upset stomach, resolved after medication intake. Possible stress-related transient elevation. Discussed the impact of NSAIDs on kidney function and blood pressure control, recommending Tylenol  as a safer alternative for pain relief. - Continue amlodipine  5 mg daily, hydrochlorothiazide  12.5 mg daily, valsartan  40 mg daily. - Monitor for leg  swelling; if occurs, increase hydrochlorothiazide  to full dose temporarily. - Avoid NSAIDs; prefer Tylenol  for pain relief. - Check blood pressure regularly, especially if symptomatic.        Respiratory   Asthma    chronic Intermittent asthma symptoms triggered by environmental factors such as strong odors and physical exertion. Symptoms managed with as-needed use of inhaler. - Continue as-needed use of inhaler. - Avoid known triggers.        Other   Vitamin B12 deficiency   Gastric reflux   Chronic Intermittent nausea and vomiting, possibly related to dietary habits and acid reflux. Symptoms exacerbated by late meals and certain foods. Discussed the use of Zofran  for nausea management. - Continue pantoprazole  for acid reflux management. - Prescribe Zofran  4 mg as needed for nausea. - Avoid late meals and trigger foods.      Other Visit Diagnoses       Nausea       associated with GERD symptoms   Relevant Medications   ondansetron  (ZOFRAN -ODT) 4 MG disintegrating tablet       Assessment &  Plan   General Health Maintenance Vitamin D  levels adequate. Discussed transitioning to over-the-counter vitamin D  supplementation. - Take over-the-counter vitamin D , 2000 IU daily. - Schedule follow-up in 6 months for physical and blood work.     Return in about 6 months (around 10/30/2024) for CPE.         Rockie Agent, MD  [facility] [phone] (phone) [phone] (fax)  [facility]

## 2024-04-29 NOTE — Assessment & Plan Note (Signed)
 Chronic hypertension, well-controlled on current regimen. Blood pressure today is 106/73 mmHg, within goal range. Previous adjustment of amlodipine  due to leg swelling, which has resolved. Recent episode of elevated blood pressure (149/92 mmHg) associated with nausea and upset stomach, resolved after medication intake. Possible stress-related transient elevation. Discussed the impact of NSAIDs on kidney function and blood pressure control, recommending Tylenol  as a safer alternative for pain relief. - Continue amlodipine  5 mg daily, hydrochlorothiazide  12.5 mg daily, valsartan  40 mg daily. - Monitor for leg swelling; if occurs, increase hydrochlorothiazide  to full dose temporarily. - Avoid NSAIDs; prefer Tylenol  for pain relief. - Check blood pressure regularly, especially if symptomatic.

## 2024-04-29 NOTE — Assessment & Plan Note (Signed)
 Chronic Intermittent nausea and vomiting, possibly related to dietary habits and acid reflux. Symptoms exacerbated by late meals and certain foods. Discussed the use of Zofran  for nausea management. - Continue pantoprazole  for acid reflux management. - Prescribe Zofran  4 mg as needed for nausea. - Avoid late meals and trigger foods.

## 2024-04-29 NOTE — Assessment & Plan Note (Signed)
 chronic Intermittent asthma symptoms triggered by environmental factors such as strong odors and physical exertion. Symptoms managed with as-needed use of inhaler. - Continue as-needed use of inhaler. - Avoid known triggers.

## 2024-05-05 DIAGNOSIS — H0014 Chalazion left upper eyelid: Secondary | ICD-10-CM | POA: Diagnosis not present

## 2024-08-12 ENCOUNTER — Ambulatory Visit (INDEPENDENT_AMBULATORY_CARE_PROVIDER_SITE_OTHER): Admitting: Family Medicine

## 2024-08-12 VITALS — BP 126/73 | HR 63 | Temp 98.4°F | Ht 64.0 in | Wt 145.8 lb

## 2024-08-12 DIAGNOSIS — J0140 Acute pansinusitis, unspecified: Secondary | ICD-10-CM

## 2024-08-12 LAB — POCT INFLUENZA A/B
Influenza A, POC: NEGATIVE
Influenza B, POC: NEGATIVE

## 2024-08-12 MED ORDER — DOXYCYCLINE HYCLATE 100 MG PO TABS: 100.0000 mg | ORAL_TABLET | Freq: Two times a day (BID) | ORAL | 0 refills | Status: AC

## 2024-08-12 NOTE — Progress Notes (Signed)
 Acute visit   Patient: Alice Turner   DOB: 12-26-1970   53 y.o. Female  MRN: [id number] PCP: Sharma Coyer, MD   Chief Complaint  Patient presents with   Acute Visit    Patient reports she began feeling bad on Sunday while on work trip. Covid test taken Monday and Tuesday both were negative. She reports started with drainage, now having a deep cough, sinus pressure between eyes and nose. She reports coughing up yellowish phlegm. She has been taking mucinex-dm and has not taken one this morining, states she started it Monday. Also reports ear pressure   Subjective    Discussed the use of AI scribe software for clinical note transcription with the patient, who gave verbal consent to proceed.  History of Present Illness   Alice Turner is a 53 year old female who presents with upper respiratory symptoms and sinus pressure.  She has been unwell since Sunday after a work trip, initially experiencing an itchy throat. Following a flight, she developed increased head pressure due to her ears not popping. By Monday, she had poor sleep, increased coughing, and significant drainage with yellow mucus. She has post-nasal drainage and coughs up mucus, especially in the mornings, occasionally leading to vomiting.  She experiences sinus pressure, ear pressure, and headaches, particularly over the last day, with sore ribs from coughing. She uses an inhaler as needed for severe coughing spells. Multiple COVID tests and a flu test today were negative. She is concerned about worsening symptoms with another flight scheduled for Monday.  Current management includes Flonase, honey, and cough drops. She is unsure if Mucinex is effective and is not a frequent medication user. There is no significant nasal running but significant coughing and mucus production.        Review of Systems  Objective    BP 126/73 (BP Location: Left Arm, Patient Position: Sitting, Cuff Size: Normal)   Pulse 63    Temp 98.4 F (36.9 C) (Oral)   Ht 5' 4 (1.626 m)   Wt 145 lb 12.8 oz (66.1 kg)   SpO2 100%   BMI 25.03 kg/m  Physical Exam Vitals reviewed.  Constitutional:      General: She is not in acute distress.    Appearance: Normal appearance. She is well-developed. She is not diaphoretic.  HENT:     Head: Normocephalic and atraumatic.     Right Ear: Ear canal and external ear normal. There is impacted cerumen.     Left Ear: Tympanic membrane, ear canal and external ear normal.     Nose: Congestion present.     Comments: Sinus TTP    Mouth/Throat:     Mouth: Mucous membranes are moist.     Pharynx: Oropharynx is clear. No oropharyngeal exudate.  Eyes:     General: No scleral icterus.    Conjunctiva/sclera: Conjunctivae normal.     Pupils: Pupils are equal, round, and reactive to light.  Cardiovascular:     Rate and Rhythm: Normal rate and regular rhythm.     Pulses: Normal pulses.     Heart sounds: Normal heart sounds. No murmur heard. Pulmonary:     Effort: Pulmonary effort is normal. No respiratory distress.     Breath sounds: Normal breath sounds. No wheezing or rales.  Musculoskeletal:     Cervical back: Neck supple.     Right lower leg: No edema.     Left lower leg: No edema.  Lymphadenopathy:  Cervical: No cervical adenopathy.  Skin:    General: Skin is warm and dry.     Findings: No rash.  Neurological:     Mental Status: She is alert.       Results for orders placed or performed in visit on 08/12/24  POCT Influenza A/B  Result Value Ref Range   Influenza A, POC Negative Negative   Influenza B, POC Negative Negative    Assessment & Plan     Problem List Items Addressed This Visit   None Visit Diagnoses       Acute non-recurrent pansinusitis    -  Primary   Relevant Medications   doxycycline  (VIBRA -TABS) 100 MG tablet   Other Relevant Orders   POCT Influenza A/B (Completed)           Acute upper respiratory infection with sinus symptoms and  eustachian tube dysfunction Symptoms began on Sunday with itchy throat, sinus pressure, and ear pressure following a work trip. Negative COVID and flu tests. Symptoms include yellow mucus, cough, postnasal drainage, and sinus pain. Likely viral etiology, but could progress to bacterial sinus infection. No ear infection on examination. Lungs are clear. - Prescribe doxycycline  to be filled if symptoms worsen over the weekend, indicating possible bacterial sinus infection. Warn about potential sun sensitivity with doxycycline . - Advise use of Flonase, two sprays each side at night, to manage sinus and ear pressure. - Recommend Afrin for use on Sunday and Monday if symptoms persist, especially before and during flights. - Suggest honey, tea with honey, or honey cough drops for cough relief. - Consider Sudafed for additional decompression if needed. - Discontinue Mucinex if not effective.  Asthma Asthma managed with inhaler as needed, especially during bad coughing spells. - Continue use of inhaler as needed for asthma symptoms.       Meds ordered this encounter  Medications   doxycycline  (VIBRA -TABS) 100 MG tablet    Sig: Take 1 tablet (100 mg total) by mouth 2 (two) times daily for 7 days.    Dispense:  14 tablet    Refill:  0     Return if symptoms worsen or fail to improve.      Jon Eva, MD  [facility] [phone] (phone) [phone] (fax)  [facility]

## 2024-09-14 DIAGNOSIS — M25811 Other specified joint disorders, right shoulder: Secondary | ICD-10-CM | POA: Diagnosis not present

## 2024-10-04 ENCOUNTER — Other Ambulatory Visit: Payer: Self-pay | Admitting: Family Medicine

## 2024-10-11 DIAGNOSIS — M25811 Other specified joint disorders, right shoulder: Secondary | ICD-10-CM | POA: Diagnosis not present

## 2024-10-25 ENCOUNTER — Other Ambulatory Visit: Payer: Self-pay | Admitting: Family Medicine

## 2024-10-25 DIAGNOSIS — Z1231 Encounter for screening mammogram for malignant neoplasm of breast: Secondary | ICD-10-CM

## 2024-10-30 NOTE — Progress Notes (Unsigned)
" ° °  Complete physical exam  Patient: Alice Sedberry AshleyFemale    DOB: 1970/10/25 54 y.o.   MRN: [id number]  No chief complaint on file.   Subjective:    Alice Turner is a 54 y.o. female who presents today for a complete physical exam. She reports consuming a {diet types:17450} diet. {types:19826} She generally feels {DESC; WELL/FAIRLY WELL/POORLY:18703}. She reports sleeping {DESC; WELL/FAIRLY WELL/POORLY:18703}. She {does/does not:200015} have additional problems to discuss today.   Discussed the use of AI scribe software for clinical note transcription with the patient, who gave verbal consent to proceed.  History of Present Illness    Most recent fall risk assessment:     No data to display           Most recent depression screenings:    04/29/2024    8:29 AM 09/17/2023    9:39 AM  PHQ 2/9 Scores  PHQ - 2 Score 0 0  PHQ- 9 Score 6  4      Data saved with a previous flowsheet row definition    {VISON DENTAL STD PSA (Optional):27386} {History (Optional):23778}  Patient Care Team: Sharma Coyer, MD as PCP - General (Family Medicine)   ROS    Objective:    There were no vitals taken for this visit. {Vitals History (Optional):23777}  Physical Exam  {PhysExam Abridge (Optional):210964309}  No results found for any visits on 11/01/24. {Show previous labs (optional):23779}     Assessment & Plan:    Routine Health Maintenance and Physical Exam Immunization History  Administered Date(s) Administered   Influenza, Seasonal, Injecte, Preservative Fre 10/30/2023   PFIZER Comirnaty(Gray Top)Covid-19 Tri-Sucrose Vaccine 12/28/2019, 01/18/2020, 05/03/2021   PFIZER(Purple Top)SARS-COV-2 Vaccination 07/28/2020   Tdap 01/03/2015    Health Maintenance  Topic Date Due   Pneumococcal Vaccine: 50+ Years (1 of 2 - PCV) Never done   Hepatitis B Vaccines 19-59 Average Risk (1 of 3 - 19+ 3-dose series) Never done   Cervical Cancer Screening (HPV/Pap Cotest)   Never done   Influenza Vaccine  05/21/2024   COVID-19 Vaccine (5 - 2025-26 season) 06/21/2024   Colonoscopy  09/27/2024   DTaP/Tdap/Td (2 - Td or Tdap) 01/02/2025   Mammogram  12/08/2025   Hepatitis C Screening  Completed   HIV Screening  Completed   Zoster Vaccines- Shingrix  Completed   HPV VACCINES  Aged Out   Meningococcal B Vaccine  Aged Out    Discussed health benefits of physical activity, and encouraged her to engage in regular exercise appropriate for her age and condition.  Problem List Items Addressed This Visit   None   Assessment and Plan Assessment & Plan     No follow-ups on file.    Coyer Sharma, MD [facility]    "

## 2024-10-30 NOTE — Patient Instructions (Incomplete)
 To keep you healthy, please keep in mind the following health maintenance items that you are due for:   Health Maintenance Due  Topic Date Due   Pneumococcal Vaccine: 50+ Years (1 of 2 - PCV) Never done   Hepatitis B Vaccines 19-59 Average Risk (1 of 3 - 19+ 3-dose series) Never done   Cervical Cancer Screening (HPV/Pap Cotest)  Never done   Influenza Vaccine  05/21/2024   COVID-19 Vaccine (5 - 2025-26 season) 06/21/2024   Colonoscopy  09/27/2024     Best Wishes,   Dr. Lang

## 2024-11-01 ENCOUNTER — Ambulatory Visit: Admitting: Family Medicine

## 2024-11-01 ENCOUNTER — Encounter: Payer: Self-pay | Admitting: Family Medicine

## 2024-11-01 VITALS — BP 126/77 | HR 77 | Temp 98.8°F | Ht 64.0 in | Wt 149.4 lb

## 2024-11-01 DIAGNOSIS — Z789 Other specified health status: Secondary | ICD-10-CM | POA: Diagnosis not present

## 2024-11-01 DIAGNOSIS — Z131 Encounter for screening for diabetes mellitus: Secondary | ICD-10-CM

## 2024-11-01 DIAGNOSIS — Z130 Encounter for screening for diseases of the blood and blood-forming organs and certain disorders involving the immune mechanism: Secondary | ICD-10-CM

## 2024-11-01 DIAGNOSIS — R600 Localized edema: Secondary | ICD-10-CM

## 2024-11-01 DIAGNOSIS — N951 Menopausal and female climacteric states: Secondary | ICD-10-CM | POA: Diagnosis not present

## 2024-11-01 DIAGNOSIS — Z1211 Encounter for screening for malignant neoplasm of colon: Secondary | ICD-10-CM

## 2024-11-01 DIAGNOSIS — Z13220 Encounter for screening for lipoid disorders: Secondary | ICD-10-CM

## 2024-11-01 DIAGNOSIS — Z124 Encounter for screening for malignant neoplasm of cervix: Secondary | ICD-10-CM | POA: Diagnosis not present

## 2024-11-01 DIAGNOSIS — Z0000 Encounter for general adult medical examination without abnormal findings: Secondary | ICD-10-CM | POA: Diagnosis not present

## 2024-11-01 DIAGNOSIS — E063 Autoimmune thyroiditis: Secondary | ICD-10-CM | POA: Diagnosis not present

## 2024-11-01 DIAGNOSIS — E538 Deficiency of other specified B group vitamins: Secondary | ICD-10-CM

## 2024-11-01 DIAGNOSIS — I10 Essential (primary) hypertension: Secondary | ICD-10-CM

## 2024-11-02 ENCOUNTER — Ambulatory Visit: Payer: Self-pay | Admitting: Family Medicine

## 2024-11-02 LAB — CMP14+EGFR
ALT: 25 IU/L (ref 0–32)
AST: 20 IU/L (ref 0–40)
Albumin: 4.4 g/dL (ref 3.8–4.9)
Alkaline Phosphatase: 82 IU/L (ref 49–135)
BUN/Creatinine Ratio: 15 (ref 9–23)
BUN: 13 mg/dL (ref 6–24)
Bilirubin Total: 0.5 mg/dL (ref 0.0–1.2)
CO2: 27 mmol/L (ref 20–29)
Calcium: 9.7 mg/dL (ref 8.7–10.2)
Chloride: 103 mmol/L (ref 96–106)
Creatinine, Ser: 0.84 mg/dL (ref 0.57–1.00)
Globulin, Total: 2.5 g/dL (ref 1.5–4.5)
Glucose: 98 mg/dL (ref 70–99)
Potassium: 4.5 mmol/L (ref 3.5–5.2)
Sodium: 144 mmol/L (ref 134–144)
Total Protein: 6.9 g/dL (ref 6.0–8.5)
eGFR: 84 mL/min/1.73

## 2024-11-02 LAB — LIPID PANEL
Chol/HDL Ratio: 1.9 ratio (ref 0.0–4.4)
Cholesterol, Total: 192 mg/dL (ref 100–199)
HDL: 101 mg/dL
LDL Chol Calc (NIH): 81 mg/dL (ref 0–99)
Triglycerides: 50 mg/dL (ref 0–149)
VLDL Cholesterol Cal: 10 mg/dL (ref 5–40)

## 2024-11-02 LAB — TSH+FREE T4
Free T4: 1.19 ng/dL (ref 0.82–1.77)
TSH: 3.27 u[IU]/mL (ref 0.450–4.500)

## 2024-11-02 LAB — CBC
Hematocrit: 41.2 % (ref 34.0–46.6)
Hemoglobin: 13.6 g/dL (ref 11.1–15.9)
MCH: 30.7 pg (ref 26.6–33.0)
MCHC: 33 g/dL (ref 31.5–35.7)
MCV: 93 fL (ref 79–97)
Platelets: 349 x10E3/uL (ref 150–450)
RBC: 4.43 x10E6/uL (ref 3.77–5.28)
RDW: 12.7 % (ref 11.7–15.4)
WBC: 6.2 x10E3/uL (ref 3.4–10.8)

## 2024-11-02 LAB — HEMOGLOBIN A1C
Est. average glucose Bld gHb Est-mCnc: 114 mg/dL
Hgb A1c MFr Bld: 5.6 % (ref 4.8–5.6)

## 2024-11-02 LAB — HEPATITIS B SURFACE ANTIBODY,QUALITATIVE: Hep B Surface Ab, Qual: NONREACTIVE

## 2024-11-02 LAB — VITAMIN B12: Vitamin B-12: 1385 pg/mL — ABNORMAL HIGH (ref 232–1245)

## 2024-11-12 ENCOUNTER — Telehealth: Payer: Self-pay

## 2024-11-12 ENCOUNTER — Other Ambulatory Visit: Payer: Self-pay

## 2024-11-12 DIAGNOSIS — Z1211 Encounter for screening for malignant neoplasm of colon: Secondary | ICD-10-CM

## 2024-11-12 MED ORDER — NA SULFATE-K SULFATE-MG SULF 17.5-3.13-1.6 GM/177ML PO SOLN: 1.0000 | Freq: Once | ORAL | 0 refills | Status: AC

## 2024-11-12 NOTE — Telephone Encounter (Signed)
 Gastroenterology Pre-Procedure Review  Request Date: 12/01/24 Requesting Physician: Dr. Theophilus  PATIENT REVIEW QUESTIONS: The patient responded to the following health history questions as indicated:    1. Are you having any GI issues? no 2. Do you have a personal history of Polyps? no 3. Do you have a family history of Colon Cancer or Polyps? no 4. Diabetes Mellitus? no 5. Joint replacements in the past 12 months?no 6. Major health problems in the past 3 months?no 7. Any artificial heart valves, MVP, or defibrillator?no    MEDICATIONS & ALLERGIES:    Patient reports the following regarding taking any anticoagulation/antiplatelet therapy:   Plavix, Coumadin, Eliquis, Xarelto, Lovenox, Pradaxa, Brilinta, or Effient? no Aspirin? no  Patient confirms/reports the following medications:  Current Outpatient Medications  Medication Sig Dispense Refill   Na Sulfate-K Sulfate-Mg Sulfate concentrate (SUPREP) 17.5-3.13-1.6 GM/177ML SOLN Take 1 kit (354 mLs total) by mouth once for 1 dose. 354 mL 0   albuterol (VENTOLIN HFA) 108 (90 Base) MCG/ACT inhaler SMARTSIG:2 Puff(s) By Mouth Every 6 Hours PRN     amLODipine  (NORVASC ) 5 MG tablet Take 1 tablet (5 mg total) by mouth daily. 90 tablet 3   cyanocobalamin  (VITAMIN B12) 1000 MCG tablet Take 1 tablet (1,000 mcg total) by mouth daily.     hydrochlorothiazide  (HYDRODIURIL ) 25 MG tablet Take 0.5 tablets (12.5 mg total) by mouth daily. 60 tablet 3   meloxicam  (MOBIC ) 15 MG tablet Take 15 mg by mouth daily.     ondansetron  (ZOFRAN -ODT) 4 MG disintegrating tablet Take 1 tablet (4 mg total) by mouth every 8 (eight) hours as needed for nausea or vomiting. 30 tablet 2   pantoprazole  (PROTONIX ) 40 MG tablet TAKE 1 TABLET DAILY 60 tablet 5   valsartan  (DIOVAN ) 40 MG tablet Take 1 tablet (40 mg total) by mouth daily. 90 tablet 3   No current facility-administered medications for this visit.    Patient confirms/reports the following allergies:   Allergies[1]  No orders of the defined types were placed in this encounter.   AUTHORIZATION INFORMATION Primary Insurance: 1D#: Group #:  Secondary Insurance: 1D#: Group #:  SCHEDULE INFORMATION: Date: 12/01/24 Time: Location: [facility]    [1]  Allergies Allergen Reactions   Codeine Other (See Comments)    passed out   Shellfish Allergy Hives and Swelling   Amoxicillin Rash   Penicillins Rash   Sulfa Antibiotics Rash

## 2024-12-01 ENCOUNTER — Ambulatory Visit: Admit: 2024-12-01

## 2024-12-21 ENCOUNTER — Encounter

## 2025-01-20 ENCOUNTER — Ambulatory Visit: Admitting: Family Medicine
